# Patient Record
Sex: Female | Born: 1965 | Race: White | Hispanic: No | Smoking: Current every day smoker
Health system: Southern US, Community
[De-identification: ages and names within clinical notes are randomized; demographics above are authoritative.]

## PROBLEM LIST (undated history)

## (undated) DIAGNOSIS — D649 Anemia, unspecified: Secondary | ICD-10-CM

## (undated) DIAGNOSIS — F329 Major depressive disorder, single episode, unspecified: Secondary | ICD-10-CM

## (undated) DIAGNOSIS — G8918 Other acute postprocedural pain: Secondary | ICD-10-CM

## (undated) DIAGNOSIS — I10 Essential (primary) hypertension: Secondary | ICD-10-CM

## (undated) DIAGNOSIS — F32A Depression, unspecified: Secondary | ICD-10-CM

## (undated) HISTORY — PX: LAPAROSCOPIC LYSIS INTESTINAL ADHESIONS: SUR778

## (undated) HISTORY — DX: Anemia, unspecified: D64.9

## (undated) HISTORY — DX: Major depressive disorder, single episode, unspecified: F32.9

## (undated) HISTORY — DX: Other acute postprocedural pain: G89.18

## (undated) HISTORY — PX: TONSILLECTOMY: SUR1361

## (undated) HISTORY — DX: Depression, unspecified: F32.A

## (undated) HISTORY — PX: HERNIA REPAIR: SHX51

---

## 1990-03-25 DIAGNOSIS — G8918 Other acute postprocedural pain: Secondary | ICD-10-CM

## 1990-03-25 HISTORY — DX: Other acute postprocedural pain: G89.18

## 1990-03-25 HISTORY — PX: PANCREATICODUODENECTOMY: SUR1000

## 1997-11-09 ENCOUNTER — Other Ambulatory Visit: Admission: RE | Admit: 1997-11-09 | Discharge: 1997-11-09 | Payer: Self-pay | Admitting: Obstetrics and Gynecology

## 1998-01-19 ENCOUNTER — Ambulatory Visit (HOSPITAL_COMMUNITY): Admission: RE | Admit: 1998-01-19 | Discharge: 1998-01-19 | Payer: Self-pay | Admitting: Gastroenterology

## 1998-01-19 ENCOUNTER — Encounter: Payer: Self-pay | Admitting: Gastroenterology

## 1998-02-02 ENCOUNTER — Encounter: Payer: Self-pay | Admitting: Gastroenterology

## 1998-02-02 ENCOUNTER — Ambulatory Visit (HOSPITAL_COMMUNITY): Admission: RE | Admit: 1998-02-02 | Discharge: 1998-02-02 | Payer: Self-pay | Admitting: Gastroenterology

## 1999-03-16 ENCOUNTER — Encounter: Payer: Self-pay | Admitting: Family Medicine

## 1999-03-16 ENCOUNTER — Encounter: Admission: RE | Admit: 1999-03-16 | Discharge: 1999-03-16 | Payer: Self-pay | Admitting: Family Medicine

## 1999-04-02 ENCOUNTER — Ambulatory Visit (HOSPITAL_COMMUNITY): Admission: RE | Admit: 1999-04-02 | Discharge: 1999-04-02 | Payer: Self-pay | Admitting: Gastroenterology

## 2000-04-27 ENCOUNTER — Emergency Department (HOSPITAL_COMMUNITY): Admission: EM | Admit: 2000-04-27 | Discharge: 2000-04-28 | Payer: Self-pay | Admitting: Emergency Medicine

## 2000-04-28 ENCOUNTER — Encounter: Payer: Self-pay | Admitting: Emergency Medicine

## 2000-09-19 ENCOUNTER — Other Ambulatory Visit: Admission: RE | Admit: 2000-09-19 | Discharge: 2000-09-19 | Payer: Self-pay | Admitting: Obstetrics and Gynecology

## 2000-10-09 ENCOUNTER — Encounter: Payer: Self-pay | Admitting: Emergency Medicine

## 2000-10-09 ENCOUNTER — Emergency Department (HOSPITAL_COMMUNITY): Admission: EM | Admit: 2000-10-09 | Discharge: 2000-10-09 | Payer: Self-pay | Admitting: Emergency Medicine

## 2001-03-25 DIAGNOSIS — D649 Anemia, unspecified: Secondary | ICD-10-CM

## 2001-03-25 HISTORY — PX: GASTRIC BYPASS: SHX52

## 2001-03-25 HISTORY — DX: Anemia, unspecified: D64.9

## 2001-10-19 ENCOUNTER — Other Ambulatory Visit (HOSPITAL_COMMUNITY): Admission: RE | Admit: 2001-10-19 | Discharge: 2001-10-30 | Payer: Self-pay | Admitting: Psychiatry

## 2002-01-12 ENCOUNTER — Inpatient Hospital Stay (HOSPITAL_COMMUNITY): Admission: EM | Admit: 2002-01-12 | Discharge: 2002-01-18 | Payer: Self-pay | Admitting: Psychiatry

## 2003-02-08 ENCOUNTER — Emergency Department (HOSPITAL_COMMUNITY): Admission: EM | Admit: 2003-02-08 | Discharge: 2003-02-08 | Payer: Self-pay | Admitting: Emergency Medicine

## 2004-01-12 ENCOUNTER — Encounter: Admission: RE | Admit: 2004-01-12 | Discharge: 2004-01-12 | Payer: Self-pay

## 2011-03-14 ENCOUNTER — Encounter (HOSPITAL_COMMUNITY): Payer: Self-pay | Admitting: Psychiatry

## 2011-03-14 ENCOUNTER — Ambulatory Visit (INDEPENDENT_AMBULATORY_CARE_PROVIDER_SITE_OTHER): Payer: BC Managed Care – PPO | Admitting: Psychiatry

## 2011-03-14 DIAGNOSIS — F329 Major depressive disorder, single episode, unspecified: Secondary | ICD-10-CM

## 2011-03-14 DIAGNOSIS — G4701 Insomnia due to medical condition: Secondary | ICD-10-CM

## 2011-03-14 DIAGNOSIS — G4700 Insomnia, unspecified: Secondary | ICD-10-CM

## 2011-03-14 DIAGNOSIS — F331 Major depressive disorder, recurrent, moderate: Secondary | ICD-10-CM | POA: Insufficient documentation

## 2011-03-14 DIAGNOSIS — G8918 Other acute postprocedural pain: Secondary | ICD-10-CM

## 2011-03-14 MED ORDER — QUETIAPINE FUMARATE 50 MG PO TABS: 50.0000 mg | ORAL_TABLET | Freq: Two times a day (BID) | ORAL | Status: DC

## 2011-03-14 MED ORDER — TRAZODONE HCL 150 MG PO TABS: 150.0000 mg | ORAL_TABLET | Freq: Every day | ORAL | Status: DC

## 2011-03-14 NOTE — Patient Instructions (Signed)
In addition to the medications he been taking as additionally the Wellbutrin and citalopram Celexa I have suggested that he take the Seroquel 50 mg 1-2 pills at night without learning half hours before bedtime to also instead of taking the Ambien which has not helped his sleep, try the trazodone half a tablet of the 150 mg or a whole tablet depending on how quickly fall asleep. If he should wake up before 2 AM usually is Ambien to help you fall back to sleep since she were only getting of 1-3 hours sleep after taking that. You have been requested to talk with the pharmacist before you take these medications by the pharmacy to be sure that there is no significant drug drug interaction with the other medications to been taking. Remember if you have any adverse effects or ineffective response to these medications you're invited to call 9 AM to 4:30 PM at our clinic on Monday through Friday. Any time after hours or on the weekend feel free to call the behavioral health center on the crisis card that you have been given that is available to you especially for the purpose of calling anyone before you have any before you've taken any action for his suicidal thoughts. Consider alternative techniques to possibly increase relieve the pain you've been experiencing. You have discussed down Internet group therapy, acupuncture hypnosis and most recently a rate he REI KI please return and 3 weeks.
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
## 2011-03-15 ENCOUNTER — Telehealth (HOSPITAL_COMMUNITY): Payer: Self-pay

## 2011-03-15 NOTE — Telephone Encounter (Signed)
PLEASE SEND RX FOR SEROQUEL AND TRAZADONE TO [facility] [address] [place] ASAP. PT WENT 2X TO PICK UP AND THEY WERE NOT THERE.

## 2011-03-15 NOTE — Telephone Encounter (Signed)
[facility] [address] Shelley Koch is contacted. The pharmacist receives to request to fill the prescriptions that were intended to be ordered yesterday: Seroquel 50 mg 1 by mouth twice a day for 30 days and trazodone 150 mg 1 by mouth each bedtime for 30 days. Pharmacist said he will call the patient,Shelley Koch, when they are ready to be picked up.

## 2011-03-21 ENCOUNTER — Telehealth (HOSPITAL_COMMUNITY): Payer: Self-pay

## 2011-03-21 DIAGNOSIS — M25569 Pain in unspecified knee: Secondary | ICD-10-CM

## 2011-03-21 DIAGNOSIS — M25559 Pain in unspecified hip: Secondary | ICD-10-CM | POA: Insufficient documentation

## 2011-03-21 NOTE — Telephone Encounter (Signed)
[facility] calls to report that after starting Seroquel and trazodone she wakes with greater intensity of pain in her knee and hip. She says she routinely uses pain medication before she gets up and uses a heat pad.  Yesterday she had an ablation of the uterus and has tolerated it very well. She took medication last night for pain and slept about 4 hours. He has been taking the one pill of Seroquel, 1 pill of trazodone and uses the Ambien if she wakes up before 2 AM. Since she began that routine, she wakes up and finds that she has greater muscle and joint pain. She routinely takes pain medication before she gets up and uses a heating pad and still, her level of pain has increased. She suspects the Seroquel and the trazodone.  It is suggested that the change in barometric pressure which has been significant with the new front coming in. This change in pressure made be taken into consideration because it often causes greater joint pain. It is suggested that she take to of the Seroquel 50 mg tabs about 1 hour before bed and then take trazodone roughly 30 minutes before sleep. She may still use the Ambien before 2 AM if she wakes  she agrees to try his plan and if she continues to have joint and muscle pain she is to stop the Seroquel altogether.

## 2011-04-01 ENCOUNTER — Other Ambulatory Visit (HOSPITAL_COMMUNITY): Payer: Self-pay | Admitting: Psychiatry

## 2011-04-02 ENCOUNTER — Ambulatory Visit (HOSPITAL_COMMUNITY): Payer: Self-pay | Admitting: Psychiatry

## 2011-04-02 ENCOUNTER — Telehealth (HOSPITAL_COMMUNITY): Payer: Self-pay | Admitting: Psychiatry

## 2011-04-02 MED ORDER — QUETIAPINE FUMARATE 100 MG PO TABS: 100.0000 mg | ORAL_TABLET | Freq: Two times a day (BID) | ORAL | Status: DC

## 2011-04-02 NOTE — Telephone Encounter (Signed)
Shelley Koch was not having restorative sleep and was told about a week ago to try to 50 mg Seroquel twice a day. She's she reported that she is getting better sleep although she doesn't always like the way the Seroquel makes her feel.  The pharmacy order is adjusted to reflect the increased dose. She will be taking Seroquel 100 mg twice a day. She is cautioned about the side effect of extra hunger. She is encouraged to snack on 0-calorie foods and drink lots of water. She plans to return to the office on Thursday.

## 2011-04-04 ENCOUNTER — Other Ambulatory Visit: Payer: Self-pay | Admitting: Internal Medicine

## 2011-04-04 ENCOUNTER — Encounter (HOSPITAL_COMMUNITY): Payer: Self-pay | Admitting: Psychiatry

## 2011-04-04 ENCOUNTER — Ambulatory Visit (INDEPENDENT_AMBULATORY_CARE_PROVIDER_SITE_OTHER): Payer: BC Managed Care – PPO | Admitting: Psychiatry

## 2011-04-04 DIAGNOSIS — F329 Major depressive disorder, single episode, unspecified: Secondary | ICD-10-CM

## 2011-04-04 DIAGNOSIS — Z1231 Encounter for screening mammogram for malignant neoplasm of breast: Secondary | ICD-10-CM

## 2011-04-04 DIAGNOSIS — G4700 Insomnia, unspecified: Secondary | ICD-10-CM

## 2011-04-04 DIAGNOSIS — E669 Obesity, unspecified: Secondary | ICD-10-CM

## 2011-04-04 MED ORDER — QUETIAPINE FUMARATE 100 MG PO TABS: 100.0000 mg | ORAL_TABLET | Freq: Two times a day (BID) | ORAL | Status: DC

## 2011-04-04 MED ORDER — TRAZODONE HCL 150 MG PO TABS: 150.0000 mg | ORAL_TABLET | Freq: Every day | ORAL | Status: DC

## 2011-04-04 MED ORDER — ZOLPIDEM TARTRATE 10 MG PO TABS: 10.0000 mg | ORAL_TABLET | Freq: Every evening | ORAL | Status: DC | PRN

## 2011-04-04 MED ORDER — CITALOPRAM HYDROBROMIDE 40 MG PO TABS: 40.0000 mg | ORAL_TABLET | Freq: Every day | ORAL | Status: DC

## 2011-04-04 MED ORDER — BUPROPION HCL ER (XL) 150 MG PO TB24: 150.0000 mg | ORAL_TABLET | Freq: Every day | ORAL | Status: DC

## 2011-04-04 NOTE — Patient Instructions (Addendum)
You have been given your prescriptions and please continue unless you have more trouble with the Seroquel he can call back and come in for a brief appointment to reevaluate that. The issue of overeating is a concern and I do refer you to weight watchers it will give you guidelines if you choose to get into a program that will help help support your effort to control your eating and that in part is caused by the medication. Remember to call behavioral Health Center [phone] if you have any extreme distress or suicidal thoughts or 911. Please return in one month.

## 2011-04-04 NOTE — Progress Notes (Signed)
Patient ID: Shelley Koch, female   DOB: 1965/09/28, 46 y.o.   MRN: [id number] Today [place] reports that she has had some improved sleep and is feeling better at work. She has been taking the Seroquel 100 mg twice a day and used as Seroquel/trazodone at night if she wakes up she uses the Ambien. She has noticed that after starting the Seroquel that she has less control of hunger and has gained some weight. This is a concern to her and the choice of Seroquel may be reevaluated and another medication substituted. She agrees to continue with the Seroquel for another month and then reevaluate the choice. She is recovering fairly well from a GYN procedure.  She believes that she is working better at work and feeling more productive. She denies any suicidal thoughts.

## 2011-04-14 ENCOUNTER — Encounter: Payer: Self-pay | Admitting: Emergency Medicine

## 2011-04-14 ENCOUNTER — Emergency Department
Admission: EM | Admit: 2011-04-14 | Discharge: 2011-04-14 | Disposition: A | Discharge status: Home / Self Care | Payer: BC Managed Care – PPO | Source: Home / Self Care | Attending: Emergency Medicine | Admitting: Emergency Medicine

## 2011-04-14 DIAGNOSIS — M7060 Trochanteric bursitis, unspecified hip: Secondary | ICD-10-CM

## 2011-04-14 DIAGNOSIS — M76899 Other specified enthesopathies of unspecified lower limb, excluding foot: Secondary | ICD-10-CM

## 2011-04-14 NOTE — ED Notes (Signed)
Right hip pain for "weeks" with recent exacerbation.

## 2011-04-14 NOTE — ED Provider Notes (Signed)
History     CSN: [id number]  Arrival date & time 04/14/11  1114   First MD Initiated Contact with Patient 04/14/11 1153      Chief Complaint  Patient presents with  . Hip Pain    (Consider location/radiation/quality/duration/timing/severity/associated sxs/prior treatment) HPI This patient presents today with right hip pain for the last few weeks. It is located on the outer aspect of her hip and leg and does radiate a little bit down the outside of her leg as well. She is already on hydrocodone for chronic pain and she does not think this is helping. She's been taking some ibuprofen as well and this may seem to help a little bit. She has been doing some working out with a Psychologist, educational but is unsure if this has caused the irritation. No previous hip injuries in the past.  Past Medical History  Diagnosis Date  . Anemia 2003  . Depression   . Pain following surgery or procedure 1992    chronic  persistent    Past Surgical History  Procedure Date  . Pancreaticoduodenectomy 1992  . Laparoscopic lysis intestinal adhesions     after 2003  . Gastric bypass 2003    Family History  Problem Relation Age of Onset  . Depression Mother     History  Substance Use Topics  . Smoking status: Never Smoker   . Smokeless tobacco: Not on file  . Alcohol Use: No    OB History    Grav Para Term Preterm Abortions TAB SAB Ect Mult Living                  Review of Systems  Allergies  Codeine and Erythromycin  Home Medications   Current Outpatient Rx  Name Route Sig Dispense Refill  . BUPROPION HCL ER (XL) 150 MG PO TB24 Oral Take 1 tablet (150 mg total) by mouth daily after breakfast. 30 tablet 1  . CITALOPRAM HYDROBROMIDE 40 MG PO TABS Oral Take 1 tablet (40 mg total) by mouth daily. 30 tablet 1  . DIAZEPAM 10 MG PO TABS      . HYDROCHLOROTHIAZIDE 12.5 MG PO CAPS      . HYDROCODONE-ACETAMINOPHEN 10-325 MG PO TABS      . HYOSCYAMINE SULFATE 0.125 MG SL SUBL      . IBUPROFEN 800 MG  PO TABS      . LISINOPRIL 10 MG PO TABS      . LISINOPRIL-HYDROCHLOROTHIAZIDE 20-25 MG PO TABS      . NORETHINDRONE ACETATE 5 MG PO TABS      . PROMETHAZINE HCL 25 MG PO TABS      . QUETIAPINE FUMARATE 100 MG PO TABS Oral Take 1 tablet (100 mg total) by mouth 2 (two) times daily. 60 tablet 1  . TRAZODONE HCL 150 MG PO TABS Oral Take 1 tablet (150 mg total) by mouth at bedtime. 30 tablet 1  . ZOLPIDEM TARTRATE 10 MG PO TABS Oral Take 1 tablet (10 mg total) by mouth at bedtime as needed for sleep. 30 tablet 1    BP 133/83  Pulse 87  Temp(Src) 98.9 F (37.2 C) (Oral)  Resp 18  Ht 5\' 10"  (1.778 m)  Wt 276 lb (125.193 kg)  BMI 39.60 kg/m2  SpO2 99%  LMP 03/04/2011  Physical Exam  Nursing note and vitals reviewed. Constitutional: She is oriented to person, place, and time. She appears well-developed and well-nourished.  HENT:  Head: Normocephalic and atraumatic.  Eyes: No scleral  icterus.  Neck: Neck supple.  Cardiovascular: Regular rhythm and normal heart sounds.   Pulmonary/Chest: Effort normal and breath sounds normal. No respiratory distress.  Musculoskeletal:       Gen. examination demonstrates that she is obese. Left hip examination demonstrates for range of motion flexion-extension and a long roll with internal and external rotation. She is tender at the greater trochanter and also down the IT band. There is no swelling or bruising or any deformity that is felt. Distal neurovascular status is intact. She also has some mild tenderness at the lateral femoral condyle.  Neurological: She is alert and oriented to person, place, and time.  Skin: Skin is warm and dry.  Psychiatric: She has a normal mood and affect. Her speech is normal.    ED Course  Procedures (including critical care time)  Labs Reviewed - No data to display No results found.   No diagnosis found.    MDM   Due to the history and examination, this is most likely a trochanteric bursitis. We have injected  her here in clinic and I advised her to use ice and gentle massage today. I've also advised her to let her trainer nose so maybe that they can start using a foam roller or other modalities. If she is not improving after the first couple days, I have given her the information for sports medicine that she can followup with and at that time consider x-rays or physical therapy. No medications were prescribed today.  Consent obtained and verified. Sterile betadine prep. Furthur cleansed with alcohol. Topical analgesic spray: Ethyl chloride. Joint: Right trochanteric bursa Completed without difficulty Meds: 1 cc of 40 mg of Depo-Medrol +3 cc of 1% lidocaine without epinephrine Needle: 22-gauge needle, 2 inches Aftercare instructions and Red flags advised.     [name], MD 04/14/11 1259

## 2011-04-16 ENCOUNTER — Encounter: Payer: Self-pay | Admitting: Family Medicine

## 2011-04-16 ENCOUNTER — Ambulatory Visit (INDEPENDENT_AMBULATORY_CARE_PROVIDER_SITE_OTHER): Payer: BC Managed Care – PPO | Admitting: Family Medicine

## 2011-04-16 VITALS — BP 151/92 | HR 111 | Temp 97.9°F | Ht 70.0 in | Wt 272.0 lb

## 2011-04-16 DIAGNOSIS — M25559 Pain in unspecified hip: Secondary | ICD-10-CM

## 2011-04-16 NOTE — Patient Instructions (Signed)
You have classic trochanteric bursitis (with secondary IT Band syndrome - these are related). Ice or heat 15 minutes at a time 3-4 times a day. Advil 4 tablets (200mg  each) three times a day with food. Continue hydrocodone for pain - discuss with who fills this if you need something stronger. Cortisone injection typically kicks in within 4-5 days - you should know a week out if it's going to help you. Avoid painful activities when possible Avoid lying on affected side Hip abduction and external rotation exercises - physical therapy will review these with you - do daily for next 6 weeks (3 sets of 10).   Follow up with me in 1 month for a recheck.

## 2011-04-18 ENCOUNTER — Encounter: Payer: Self-pay | Admitting: Family Medicine

## 2011-04-18 NOTE — Assessment & Plan Note (Signed)
Right greater trochanteric bursitis - classic presentation.  Just had cortisone injection and informed this can take up to a week for benefit.  Has vicodin which she takes for chronic pain.  Will start PT and home exercise/stretching program.  Icing as needed.  Advil as needed.  F/u in 1 month.  Can try repeat injection if not improving.  Doubt MRI of low back or hip would be beneficial based on history and exam.

## 2011-04-18 NOTE — Progress Notes (Signed)
Subjective:    Patient ID: Shelley Koch, female    DOB: 25-Jan-1966, 46 y.o.   MRN: [id number]  PCP: Dr. Damien Fusi  HPI 46 yo F here for right hip pain.  Patient denies any known right hip injury. States over past several weeks has had lateral right hip pain worse with lying on this side. + night pain. No back pain No numbness/tingling. Radiates only down lateral aspect of upper leg. No problems with left leg. Went to [facility] on Sunday and had injection that made this worse. No having pain with walking and prolonged standing. No groin pain.  Past Medical History  Diagnosis Date  . Anemia 2003  . Depression   . Pain following surgery or procedure 1992    chronic  persistent    Current Outpatient Prescriptions on File Prior to Visit  Medication Sig Dispense Refill  . buPROPion (WELLBUTRIN XL) 150 MG 24 hr tablet Take 1 tablet (150 mg total) by mouth daily after breakfast.  30 tablet  1  . citalopram (CELEXA) 40 MG tablet Take 1 tablet (40 mg total) by mouth daily.  30 tablet  1  . diazepam (VALIUM) 10 MG tablet       . HYDROcodone-acetaminophen (NORCO) 10-325 MG per tablet       . hyoscyamine (LEVSIN SL) 0.125 MG SL tablet       . ibuprofen (ADVIL,MOTRIN) 800 MG tablet       . lisinopril-hydrochlorothiazide (PRINZIDE,ZESTORETIC) 20-25 MG per tablet       . norethindrone (AYGESTIN) 5 MG tablet       . promethazine (PHENERGAN) 25 MG tablet       . QUEtiapine (SEROQUEL) 100 MG tablet Take 1 tablet (100 mg total) by mouth 2 (two) times daily.  60 tablet  1  . traZODone (DESYREL) 150 MG tablet Take 1 tablet (150 mg total) by mouth at bedtime.  30 tablet  1  . zolpidem (AMBIEN) 10 MG tablet Take 1 tablet (10 mg total) by mouth at bedtime as needed for sleep.  30 tablet  1    Past Surgical History  Procedure Date  . Pancreaticoduodenectomy 1992  . Laparoscopic lysis intestinal adhesions     after 2003  . Gastric bypass 2003  . Hernia repair     Allergies  Allergen  Reactions  . Codeine   . Erythromycin     All 'mycin' family abx    History   Social History  . Marital Status: Divorced    Spouse Name: N/A    Number of Children: N/A  . Years of Education: N/A   Occupational History  . Not on file.   Social History Main Topics  . Smoking status: Current Everyday Smoker -- 1.5 packs/day  . Smokeless tobacco: Not on file   Comment: 1/2 pack per day  . Alcohol Use: No  . Drug Use: No  . Sexually Active: Not Currently   Other Topics Concern  . Not on file   Social History Narrative  . No narrative on file    Family History  Problem Relation Age of Onset  . Depression Mother   . Hypertension Mother   . Hyperlipidemia Father   . Heart attack Father   . Diabetes Neg Hx   . Sudden death Neg Hx     BP 151/92  Pulse 111  Temp(Src) 97.9 F (36.6 C) (Oral)  Ht 5\' 10"  (1.778 m)  Wt 272 lb (123.378 kg)  BMI 39.03 kg/m2  LMP 03/04/2011  Review of Systems See HPI above.    Objective:   Physical Exam Gen: NAD  R hip: No gross deformity, bruising.  Mild swelling over greater trochanter. TTP at greater trochanter reproducing her pain.  No SI, lower back TTP.  TTP mildly through IT band. FROM with negative logroll. Strength 4-/5 with hip abduction and painful, 5/5 on left side.   NVI distally.    Assessment & Plan:  1. Right greater trochanteric bursitis - classic presentation.  Just had cortisone injection and informed this can take up to a week for benefit.  Has vicodin which she takes for chronic pain.  Will start PT and home exercise/stretching program.  Icing as needed.  Advil as needed.  F/u in 1 month.  Can try repeat injection if not improving.  Doubt MRI of low back or hip would be beneficial based on history and exam.

## 2011-04-19 DIAGNOSIS — E669 Obesity, unspecified: Secondary | ICD-10-CM | POA: Insufficient documentation

## 2011-04-22 ENCOUNTER — Encounter: Payer: Self-pay | Admitting: Family Medicine

## 2011-04-22 ENCOUNTER — Ambulatory Visit (INDEPENDENT_AMBULATORY_CARE_PROVIDER_SITE_OTHER): Payer: BC Managed Care – PPO | Admitting: Family Medicine

## 2011-04-22 ENCOUNTER — Ambulatory Visit (HOSPITAL_BASED_OUTPATIENT_CLINIC_OR_DEPARTMENT_OTHER)
Admission: RE | Admit: 2011-04-22 | Discharge: 2011-04-22 | Disposition: A | Discharge status: Home / Self Care | Payer: BC Managed Care – PPO | Source: Ambulatory Visit | Attending: Family Medicine | Admitting: Family Medicine

## 2011-04-22 VITALS — BP 148/92 | HR 92 | Temp 98.1°F | Ht 70.0 in | Wt 270.0 lb

## 2011-04-22 DIAGNOSIS — M25562 Pain in left knee: Secondary | ICD-10-CM

## 2011-04-22 DIAGNOSIS — M25559 Pain in unspecified hip: Secondary | ICD-10-CM

## 2011-04-22 DIAGNOSIS — M25569 Pain in unspecified knee: Secondary | ICD-10-CM

## 2011-04-22 NOTE — Assessment & Plan Note (Signed)
likely a dynamic issue and worsened from gait change from severe right hip pain.  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  Degenerative meniscal tear another consideration.  Consider intraarticular cortisone injection if this continues to worsen.

## 2011-04-22 NOTE — Progress Notes (Signed)
Subjective:    Patient ID: Shelley Koch, female    DOB: 09/02/1965, 46 y.o.   MRN: [id number]  PCP: Dr. Damien Fusi  Hip Pain   Knee Pain    46 yo F here for f/u right hip pain.  1/22: Patient denies any known right hip injury. States over past several weeks has had lateral right hip pain worse with lying on this side. + night pain. No back pain No numbness/tingling. Radiates only down lateral aspect of upper leg. No problems with left leg. Went to [facility] on Sunday and had injection that made this worse. No having pain with walking and prolonged standing. No groin pain.  1/28: Patient reports right lateral hip pain has not improved with injection - would like to try repeat injection. Due to start PT on Wednesday for this. No real change in symptoms otherwise from 1/22 visit. Has had increasing left knee pain - anteriorly medial > lateral. No catching, locking, giving out. No swelling or bruising. Pain in knee worse over weekend and has bothered her in past when working out.  Past Medical History  Diagnosis Date  . Anemia 2003  . Depression   . Pain following surgery or procedure 1992    chronic  persistent    Current Outpatient Prescriptions on File Prior to Visit  Medication Sig Dispense Refill  . buPROPion (WELLBUTRIN XL) 150 MG 24 hr tablet Take 1 tablet (150 mg total) by mouth daily after breakfast.  30 tablet  1  . citalopram (CELEXA) 40 MG tablet Take 1 tablet (40 mg total) by mouth daily.  30 tablet  1  . diazepam (VALIUM) 10 MG tablet       . HYDROcodone-acetaminophen (NORCO) 10-325 MG per tablet       . hyoscyamine (LEVSIN SL) 0.125 MG SL tablet       . ibuprofen (ADVIL,MOTRIN) 800 MG tablet       . lisinopril-hydrochlorothiazide (PRINZIDE,ZESTORETIC) 20-25 MG per tablet       . norethindrone (AYGESTIN) 5 MG tablet       . promethazine (PHENERGAN) 25 MG tablet       . QUEtiapine (SEROQUEL) 100 MG tablet Take 1 tablet (100 mg total) by mouth 2 (two)  times daily.  60 tablet  1  . traZODone (DESYREL) 150 MG tablet Take 1 tablet (150 mg total) by mouth at bedtime.  30 tablet  1  . zolpidem (AMBIEN) 10 MG tablet Take 1 tablet (10 mg total) by mouth at bedtime as needed for sleep.  30 tablet  1    Past Surgical History  Procedure Date  . Pancreaticoduodenectomy 1992  . Laparoscopic lysis intestinal adhesions     after 2003  . Gastric bypass 2003  . Hernia repair     Allergies  Allergen Reactions  . Codeine   . Erythromycin     All 'mycin' family abx    History   Social History  . Marital Status: Divorced    Spouse Name: N/A    Number of Children: N/A  . Years of Education: N/A   Occupational History  . Not on file.   Social History Main Topics  . Smoking status: Current Everyday Smoker -- 1.5 packs/day  . Smokeless tobacco: Not on file   Comment: 1/2 pack per day  . Alcohol Use: No  . Drug Use: No  . Sexually Active: Not Currently   Other Topics Concern  . Not on file   Social History Narrative  .  No narrative on file    Family History  Problem Relation Age of Onset  . Depression Mother   . Hypertension Mother   . Hyperlipidemia Father   . Heart attack Father   . Diabetes Neg Hx   . Sudden death Neg Hx     BP 148/92  Pulse 92  Temp(Src) 98.1 F (36.7 C) (Oral)  Ht 5\' 10"  (1.778 m)  Wt 270 lb (122.471 kg)  BMI 38.74 kg/m2  LMP 03/04/2011  Review of Systems  See HPI above.    Objective:   Physical Exam  Gen: NAD  R hip: No gross deformity, bruising.  Mild swelling over greater trochanter. TTP at greater trochanter reproducing her pain.  No SI, lower back TTP.  TTP mildly through IT band. FROM with negative logroll. Strength 4-/5 with hip abduction and painful, 5/5 on left side.   NVI distally.  L knee: No gross deformity, ecchymoses, swelling. Mild medial > lateral joint line TTP.  No post patellar facet TTP. FROM. Negative ant/post drawers. Negative valgus/varus testing. Negative  lachmanns. Negative mcmurrays, apleys, patellar apprehension. NV intact distally.     Assessment & Plan:  1. Right greater trochanteric bursitis - classic presentation.  Repeat cortisone injection today.  Continue advil, vicodin prn.  Start PT on Wednesday.  If still severe would consider lumbar spine MRI (doubt intraarticular hip pathology - negative logroll and no groin pain) as sometimes radiculopathy presents in this fashion though is less likely.  F/u in 1 month.  2. Left knee pain - likely a dynamic issue and worsened from gait change from severe right hip pain.  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  Degenerative meniscal tear another consideration.  Consider intraarticular cortisone injection if this continues to worsen.

## 2011-04-22 NOTE — Assessment & Plan Note (Signed)
Right greater trochanteric bursitis - classic presentation.  Repeat cortisone injection today.  Continue advil, vicodin prn.  Start PT on Wednesday.  If still severe would consider lumbar spine MRI (doubt intraarticular hip pathology - negative logroll and no groin pain) as sometimes radiculopathy presents in this fashion though is less likely.  F/u in 1 month.

## 2011-04-23 ENCOUNTER — Ambulatory Visit: Payer: Self-pay

## 2011-04-23 ENCOUNTER — Ambulatory Visit
Admission: RE | Admit: 2011-04-23 | Discharge: 2011-04-23 | Disposition: A | Discharge status: Home / Self Care | Payer: BC Managed Care – PPO | Source: Ambulatory Visit | Attending: Internal Medicine | Admitting: Internal Medicine

## 2011-04-23 DIAGNOSIS — Z1231 Encounter for screening mammogram for malignant neoplasm of breast: Secondary | ICD-10-CM

## 2011-04-24 ENCOUNTER — Ambulatory Visit: Payer: BC Managed Care – PPO | Admitting: Physical Therapy

## 2011-05-31 ENCOUNTER — Other Ambulatory Visit (HOSPITAL_COMMUNITY): Payer: Self-pay | Admitting: Psychiatry

## 2011-05-31 DIAGNOSIS — G4700 Insomnia, unspecified: Secondary | ICD-10-CM

## 2011-05-31 MED ORDER — ZOLPIDEM TARTRATE 10 MG PO TABS: 10.0000 mg | ORAL_TABLET | Freq: Every evening | ORAL | Status: DC | PRN

## 2011-06-06 ENCOUNTER — Other Ambulatory Visit (HOSPITAL_COMMUNITY): Payer: Self-pay

## 2011-06-06 ENCOUNTER — Other Ambulatory Visit (HOSPITAL_COMMUNITY): Payer: Self-pay | Admitting: Psychiatry

## 2011-06-06 DIAGNOSIS — G4700 Insomnia, unspecified: Secondary | ICD-10-CM

## 2011-06-06 MED ORDER — TRAZODONE HCL 150 MG PO TABS: 150.0000 mg | ORAL_TABLET | Freq: Every day | ORAL | Status: DC

## 2011-06-06 NOTE — Telephone Encounter (Signed)
ok 

## 2011-06-14 ENCOUNTER — Ambulatory Visit (HOSPITAL_COMMUNITY): Payer: Self-pay | Admitting: Psychiatry

## 2011-06-28 ENCOUNTER — Encounter (HOSPITAL_COMMUNITY): Payer: Self-pay | Admitting: Psychiatry

## 2011-06-28 ENCOUNTER — Ambulatory Visit (INDEPENDENT_AMBULATORY_CARE_PROVIDER_SITE_OTHER): Payer: BC Managed Care – PPO | Admitting: Psychiatry

## 2011-06-28 VITALS — BP 158/85 | HR 85 | Ht 70.0 in | Wt 280.0 lb

## 2011-06-28 DIAGNOSIS — F329 Major depressive disorder, single episode, unspecified: Secondary | ICD-10-CM

## 2011-06-28 DIAGNOSIS — G4700 Insomnia, unspecified: Secondary | ICD-10-CM

## 2011-06-28 MED ORDER — ZOLPIDEM TARTRATE 10 MG PO TABS: 10.0000 mg | ORAL_TABLET | Freq: Every evening | ORAL | Status: DC | PRN

## 2011-06-28 MED ORDER — QUETIAPINE FUMARATE 100 MG PO TABS: 150.0000 mg | ORAL_TABLET | Freq: Two times a day (BID) | ORAL | Status: DC

## 2011-06-28 NOTE — Progress Notes (Signed)
Psychiatric Assessment Adult  Patient Identification:  Shelley Koch Date of Evaluation:  06/28/2011 Chief Complaint: "I was referred by my family doctor." History of Chief Complaint:    HPI Comments: The patient 46 y/o with  Major Depressive Disorder, moderate, reccurrent.  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 She was started Zoloft then Paxil, prozac, effexor, lexapro. The  Patient's reports one suicide attempt in 2003, by over dose. She reports she was divorced, lost her job, and was given a large prescription adderall.  She states that she started becoming manic and hallucinating.  Review of Systems  Psychiatric/Behavioral: Positive for dysphoric mood. Negative for suicidal ideas, behavioral problems, confusion, sleep disturbance, self-injury and agitation.   Physical Exam  Vitals reviewed. Constitutional: She appears well-developed and well-nourished. No distress.  Skin: She is not diaphoretic.    Depressive Symptoms: depressed mood, helplessness, Fatigue  (Hypo) Manic Symptoms:   Elevated Mood:  No Irritable Mood:  No Grandiosity:  No Distractibility:  No Lability of Mood:  No Delusions:  No Hallucinations:  No Impulsivity:  No Sexually Inappropriate Behavior:  No Financial Extravagance:  No Flight of Ideas:  No  Anxiety Symptoms: Excessive Worry:  Yes-specifically related to pain Panic Symptoms:  Yes Agoraphobia:  No Obsessive Compulsive: No  Symptoms: None, Specific Phobias:  Yes-dentist Social Anxiety:  No  Psychotic Symptoms:  Hallucinations: Yes None Delusions:  No Paranoia:  Yes-only when depressed, regarding whether people like her.   Ideas of Reference:  No  PTSD Symptoms: Ever had a traumatic exposure:  Yes Had a traumatic exposure in the last month:  No Re-experiencing: No None Hypervigilance:  No Hyperarousal: No None Avoidance: No None  Traumatic Brain  Injury: No   Past Psychiatric History: Diagnosis: MDD  Hospitalizations: One in 2003  Outpatient Care: Since the age of 60  Substance Abuse Care: none  Self-Mutilation: Patient denies  Suicidal Attempts: One attempt 2003.  Violent Behaviors: None   Past Medical History:   Past Medical History  Diagnosis Date  . Anemia 2003  . Pain following surgery or procedure 1992   History of Loss of Consciousness:  No Seizure History:  No Cardiac History:  No Allergies:   Allergies  Allergen Reactions  . Codeine   . Erythromycin     All 'mycin' family abx   Current Medications:  Current Outpatient Prescriptions  Medication Sig Dispense Refill  . buPROPion (WELLBUTRIN XL) 150 MG 24 hr tablet Take 1 tablet (150 mg total) by mouth daily after breakfast.  30 tablet  1  . citalopram (CELEXA) 40 MG tablet Take 1 tablet (40 mg total) by mouth daily.  30 tablet  1  . diazepam (VALIUM) 10 MG tablet       . HYDROcodone-acetaminophen (NORCO) 10-325 MG per tablet       . hyoscyamine (LEVSIN SL) 0.125 MG SL tablet       . ibuprofen (ADVIL,MOTRIN) 800 MG tablet       . lisinopril-hydrochlorothiazide (PRINZIDE,ZESTORETIC) 20-25 MG per tablet       . norethindrone (AYGESTIN) 5 MG tablet       . promethazine (PHENERGAN) 25 MG tablet       . QUEtiapine (SEROQUEL) 100 MG tablet Take 1 tablet (100 mg total) by mouth 2 (two) times daily.  60 tablet  1  . traZODone (DESYREL) 150 MG tablet Take 1 tablet (150 mg total) by mouth at  bedtime.  30 tablet  1  . zolpidem (AMBIEN) 10 MG tablet Take 1 tablet (10 mg total) by mouth at bedtime as needed for sleep.  30 tablet  0    Previous Psychotropic Medications:  Medication  Adderall  Prozac  Paxil  Zoloft  Citalopram  Ambien  Trazodone   Substance Abuse History in the last 12 months: Nicotine: Patient denies Alcohol: Patient denies Drug: Patient denies   Social History: Current Place of Residence: [place], [place] Place of Birth: [place],  [place] Family Members: Father and brother. Marital Status:  Divorced Children: None Relationships: Father is main source of support. Education:  Print production planner Problems/Performance: Fair Religious Beliefs/Practices: Pray. History of Abuse: none Occupational Experiences: Writer History:  None. Legal History: Patient. Hobbies/Interests: Work-shopping.  Family History:   Family History  Problem Relation Age of Onset  . Depression Mother   . Hypertension Mother   . Hyperlipidemia Father   . Heart attack Father   . Diabetes Neg Hx   . Sudden death Neg Hx     Mental Status Examination/Evaluation: Objective:  Appearance: Casual and Well Groomed  Eye Contact::  Good  Speech:  Clear and Coherent  Volume:  Normal  Mood:  "exasperated.  Affect:  Appropriate  Thought Process:  Coherent, Logical and Loose  Orientation:  Full  Thought Content:  WDL  Suicidal Thoughts:  No  Homicidal Thoughts:  No  Judgement:  Fair  Insight:  Good  Psychomotor Activity:  Normal  Akathisia:  No  Handed:  Right  AIMS (if indicated):  None  Assets:  Communication Skills Desire for Improvement Financial Resources/Insurance Social Support Talents/Skills Transportation Vocational/Educational   Assessment:   AXIS I Major Depressive Disorder, Moderate, recurrent  AXIS II No diagnosis  AXIS III  . Anemia 2003  . Pain following surgery or procedure 1992     AXIS IV other psychosocial or environmental problems  AXIS V 51-60 moderate symptoms   Treatment Plan/Recommendations:  1. Affirm with the patient that the medications are taken as ordered. Patient expressed understanding of how their medications were to be used.  2. Continue the following psychiatric medications as written prior to this appointment/ with the following changes:  a) Patient wishes to stop Seroquel due to sedation. She is currently taking 200 mg Will taper Seroquel over 3-4 weeks b) Continue buproprion XL 150  mg daily c) Continue Celexa 40 mg d) Continue zolpidem 10 mg daily e) Discontinue trazodone, patient has stopped taking this medication. f)  Discontinue Diazepam-patient is no longer taking this medication. 3. Therapy: brief supportive therapy provided. Continue current services.  4. Risks and benefits, side effects and alternatives discussed with patient, he/she was given an opportunity to ask questions about her medication, illness, and treatment. All current psychiatric medications have been reviewed and discussed with the patient and adjusted as clinically appropriate. The patient has been provided an accurate and updated list of the medications being now prescribed.  5. Patient told to call clinic if any problems occur. Patient advised to go to ER  if she should develop SI/HI, side effects, or if symptoms worsen. Has crisis numbers to call if needed.   6. No labs warranted at this time.   7. The patient was encouraged to keep all PCP and specialty clinic appointments.  8. Patient was instructed to return to clinic in 1 month.  9. The patient was advised to call and cancel their mental health appointment  within 24 hours of the appointment,  if they are unable to keep the appointment.  10. The patient expressed understanding     Jacqulyn Cane, MD 4/5/201311:20 AM

## 2011-07-01 ENCOUNTER — Telehealth (HOSPITAL_COMMUNITY): Payer: Self-pay

## 2011-07-01 DIAGNOSIS — G4700 Insomnia, unspecified: Secondary | ICD-10-CM

## 2011-07-01 DIAGNOSIS — F329 Major depressive disorder, single episode, unspecified: Secondary | ICD-10-CM

## 2011-07-01 MED ORDER — QUETIAPINE FUMARATE 50 MG PO TABS: 50.0000 mg | ORAL_TABLET | ORAL | Status: DC

## 2011-07-01 MED ORDER — TRAZODONE HCL 150 MG PO TABS: 150.0000 mg | ORAL_TABLET | Freq: Every day | ORAL | Status: DC

## 2011-07-01 NOTE — Telephone Encounter (Signed)
Pt called to advise per Dr. Request she has seraquel 100mg  12 tabs left, trazadone 150mg  6 tabs left from previous prescription/provider.

## 2011-07-01 NOTE — Telephone Encounter (Signed)
Will provide prescription for tapering dose of Seroquel.  Plan:  1. Patient oft

## 2011-07-02 ENCOUNTER — Encounter (HOSPITAL_COMMUNITY): Payer: Self-pay | Admitting: Psychiatry

## 2011-07-12 ENCOUNTER — Encounter (HOSPITAL_COMMUNITY): Payer: Self-pay | Admitting: Psychiatry

## 2011-07-12 ENCOUNTER — Ambulatory Visit (INDEPENDENT_AMBULATORY_CARE_PROVIDER_SITE_OTHER): Payer: BC Managed Care – PPO | Admitting: Psychiatry

## 2011-07-12 VITALS — BP 156/91 | HR 103 | Ht 70.0 in | Wt 286.0 lb

## 2011-07-12 DIAGNOSIS — F331 Major depressive disorder, recurrent, moderate: Secondary | ICD-10-CM

## 2011-07-12 DIAGNOSIS — G4700 Insomnia, unspecified: Secondary | ICD-10-CM

## 2011-07-12 DIAGNOSIS — F329 Major depressive disorder, single episode, unspecified: Secondary | ICD-10-CM

## 2011-07-12 MED ORDER — ZOLPIDEM TARTRATE 10 MG PO TABS: 10.0000 mg | ORAL_TABLET | Freq: Every evening | ORAL | Status: DC | PRN

## 2011-07-12 NOTE — Progress Notes (Signed)
[facility] Follow-up Outpatient Visit  Shelley Koch 06-Nov-1965  Date:  07/12/2010   History of Chief Complaint:  HPI Comments: The patient 46 y/o with Major Depressive Disorder, moderate, recurrent.  The patient reports some irritability but relates this to the simultaneous discontinuation of Topamax while we have been tapering quetiapine, though we had discussed the advantages of tapering only one medication at a time. The patient states she had decreased quetiapine up to 50 mg, but when she completely discontinued quetiapine she had an uncomfortable feeling. She reports that she continues to take quetiapine at 50 mg. She does endorse poor sleep and has been using quetiapine in combination with zolpidem. She denies any recent suicidal or homicidal ideation, intent or plans. She denies any medication side effects.  She report that she has been having difficulty motivating herself to get out of the house due to lack of motivation and energy.  Depressive Symptoms: depressed mood,  helplessness, Fatigue   (Hypo) Manic Symptoms:  Elevated Mood: No  Irritable Mood: No  Grandiosity: No  Distractibility: No  Lability of Mood: No  Delusions: No  Hallucinations: No  Impulsivity: No  Sexually Inappropriate Behavior: No  Financial Extravagance: No  Flight of Ideas: No   Anxiety Symptoms:  Excessive Worry: Yes-specifically related to pain  Panic Symptoms: Yes  Agoraphobia: No  Obsessive Compulsive: No  Symptoms: None,  Specific Phobias: Yes-dentist  Social Anxiety: No   Psychotic Symptoms:  Hallucinations: Yes None  Delusions: No  Paranoia: Yes-only when depressed, regarding whether people like her.  Ideas of Reference: No   PTSD Symptoms:  Ever had a traumatic exposure: Yes  Had a traumatic exposure in the last month: No  Re-experiencing: No None  Hypervigilance: No  Hyperarousal: No None  Avoidance: No None   Traumatic Brain Injury: No   Review of Systems    Psychiatric/Behavioral: Positive for dysphoric mood. Negative for suicidal ideas, behavioral problems, confusion, sleep disturbance, self-injury and agitation.   Physical Exam  Vitals reviewed.  Constitutional: She appears well-developed and well-nourished. No distress.  Skin: She is not diaphoretic.   Past Psychiatric History:  Diagnosis: Major Depressive Disorder   Hospitalizations: One in 2003   Outpatient Care: Since the age of 62   Substance Abuse Care: none   Self-Mutilation: Patient denies   Suicidal Attempts: One attempt 2003.   Violent Behaviors: None    Past Medical History:  Past Medical History   Diagnosis  Date   .  Anemia  2003   .  Pain following surgery or procedure  1992    History of Loss of Consciousness: No  Seizure History: No  Cardiac History: No  Allergies:  Allergen   Codeine   Erythromycin    Current Medications:  Current Outpatient Prescriptions   Medication  Sig  Dispense  Refill   .  buPROPion (WELLBUTRIN XL) 150 MG 24 hr tablet  Take 1 tablet (150 mg total) by mouth daily after breakfast.  30 tablet  1   .  citalopram (CELEXA) 40 MG tablet  Take 1 tablet (40 mg total) by mouth daily.  30 tablet  1   .  diazepam (VALIUM) 10 MG tablet      .  HYDROcodone-acetaminophen (NORCO) 10-325 MG per tablet      .  hyoscyamine (LEVSIN SL) 0.125 MG SL tablet      .  ibuprofen (ADVIL,MOTRIN) 800 MG tablet      .  lisinopril-hydrochlorothiazide (PRINZIDE,ZESTORETIC) 20-25 MG per  tablet      .  norethindrone (AYGESTIN) 5 MG tablet      .  promethazine (PHENERGAN) 25 MG tablet      .  QUEtiapine (SEROQUEL) 100 MG tablet  Take 1 tablet (100 mg total) by mouth 2 (two) times daily.  60 tablet  1   .  traZODone (DESYREL) 150 MG tablet  Take 1 tablet (150 mg total) by mouth at bedtime.  30 tablet  1   .  zolpidem (AMBIEN) 10 MG tablet  Take 1 tablet (10 mg total) by mouth at bedtime as needed for sleep.  30 tablet  0    Previous Psychotropic Medications:   Medication   Adderall   Prozac   Paxil   Zoloft   Citalopram   Ambien   Trazodone    Substance Abuse History in the last 12 months:  Nicotine: Patient denies  Alcohol: Patient denies  Drug: Patient denies   Filed Vitals:   07/12/11 1117  BP: 156/91  Pulse: 103  Height: 5\' 10"  (1.778 m)  Weight: 286 lb (129.729 kg)    Mental Status Examination/Evaluation:  Objective: Appearance: Casual and Well Groomed   Eye Contact:: Good   Speech: Clear and Coherent   Volume: Normal   Mood: "good"  Affect: Appropriate   Thought Process: Coherent, Logical and Loose   Orientation: Full   Thought Content: WDL   Suicidal Thoughts: No   Homicidal Thoughts: No   Judgement: Fair   Insight: Good   Psychomotor Activity: Normal   Akathisia: No   Handed: Right   AIMS (if indicated): None   Assets: Communication Skills  Desire for Improvement  Financial Resources/Insurance  Social Support  Talents/Skills  Transportation  Vocational/Educational   Assessment:  AXIS I  Major Depressive Disorder, Moderate, recurrent   AXIS II  No diagnosis   AXIS III  .  Anemia  2003    .  Pain following surgery or procedure  1992   AXIS IV  other psychosocial or environmental problems   AXIS V  51-60 moderate symptoms     Treatment Plan/Recommendations:    1. Affirm with the patient that the medications are taken as ordered. Patient expressed understanding of how their medications were to be used.  2. Continue the following psychiatric medications as written prior to this appointment/ with the following changes:  a) Patient is currently taking Seroquel 50 mg daily, she has tried to stop this but has an uncomfortable feeling,therefore will continue this medication and attempt to decrease this medication to half of a 50 mg tablet.  b) Continue buproprion XL 150 mg daily  c) Continue Celexa 40 mg  d) Continue zolpidem 10 mg daily if she fails to sleep with Trazodone. e) Continue Trazodone 150 mg  daily f) Discontinue Diazepam-patient is no longer taking this medication.  3. Therapy: brief supportive therapy provided. Continue current services.  4. Risks and benefits, side effects and alternatives discussed with patient, he/she was given an opportunity to ask questions about her medication, illness, and treatment. All current psychiatric medications have been reviewed and discussed with the patient and adjusted as clinically appropriate. The patient has been provided an accurate and updated list of the medications being now prescribed.  5. Patient told to call clinic if any problems occur. Patient advised to go to ER if she should develop SI/HI, side effects, or if symptoms worsen. Has crisis numbers to call if needed.  6. No labs warranted at this time.  7. The patient was encouraged to keep all PCP and specialty clinic appointments.  8. Patient was instructed to return to clinic in 1 month.  9.  The patient expressed understanding of the plan and agrees with the above.  Jacqulyn Cane, MD

## 2011-08-09 ENCOUNTER — Encounter (HOSPITAL_COMMUNITY): Payer: Self-pay | Admitting: Psychiatry

## 2011-08-09 ENCOUNTER — Ambulatory Visit (INDEPENDENT_AMBULATORY_CARE_PROVIDER_SITE_OTHER): Payer: BC Managed Care – PPO | Admitting: Psychiatry

## 2011-08-09 VITALS — BP 139/67 | HR 90 | Ht 70.0 in | Wt 286.0 lb

## 2011-08-09 DIAGNOSIS — F331 Major depressive disorder, recurrent, moderate: Secondary | ICD-10-CM

## 2011-08-09 NOTE — Progress Notes (Signed)
[facility] Follow-up Outpatient Visit  Shelley Koch 1965-09-28  Date:   Patient Identification: Shelley Koch  Date of Evaluation: 06/28/2011   History of Chief Complaint:  HPI Comments: The patient 46 y/o with Major Depressive Disorder, moderate, reccurrent.  The patient reports that her physical symptoms have worsened which has worsened her mood.  The patient states that she has had several medical evaluations for pain which were negative for any discernible pathology.  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 She states that she made a minor mistake at work which did not result in any adverse event but has her questioning her abilities.   Review of Systems   Psychiatric/Behavioral: Positive for dysphoric mood. Negative for suicidal ideas, behavioral problems, confusion, sleep disturbance, self-injury and agitation.   Depressive Symptoms: depressed mood,  helplessness, Fatigue   (Hypo) Manic Symptoms:  Elevated Mood: No  Irritable Mood: No  Grandiosity: No  Distractibility: No  Lability of Mood: No  Delusions: No  Hallucinations: No  Impulsivity: No  Sexually Inappropriate Behavior: No  Financial Extravagance: No  Flight of Ideas: No  Anxiety Symptoms:  Excessive Worry: No  Panic Symptoms: No Agoraphobia: No  Obsessive Compulsive: No  Symptoms: None,  Specific Phobias: Yes-dentist  Social Anxiety: No   Psychotic Symptoms:  Hallucinations: Yes None  Delusions: No  Paranoia: Yes-only when depressed, regarding whether people like her.  Ideas of Reference: No   PTSD Symptoms:  Ever had a traumatic exposure: Yes  Had a traumatic exposure in the last month: No  Re-experiencing: No None  Hypervigilance: No  Hyperarousal: No None  Avoidance: No None  Traumatic Brain Injury: No   Filed Vitals:   08/09/11 1405  BP: 139/67  Pulse: 90  Height: 5\' 10"  (1.778 m)  Weight: 286 lb (129.729 kg)     Physical Exam  Vitals reviewed.    Constitutional: She appears well-developed and well-nourished. No distress.  Skin: She is not diaphoretic.     Past Psychiatric History: Reviewed Diagnosis: MDD   Hospitalizations: One in 2003   Outpatient Care: Since the age of 58   Substance Abuse Care: none   Self-Mutilation: Patient denies   Suicidal Attempts: One attempt 2003.   Violent Behaviors: None    Past Medical History: Reviewed Past Medical History   Diagnosis  Date   .  Anemia  2003   .  Pain following surgery or procedure  1992    History of Loss of Consciousness: No  Seizure History: No  Cardiac History: No   Allergies: Reviewed Allergies   Allergen  Reactions   .  Codeine    .  Erythromycin      All 'mycin' family abx    Current Medications: Reviewed Current Outpatient Prescriptions on File Prior to Visit  Medication Sig Dispense Refill  . buPROPion (WELLBUTRIN XL) 150 MG 24 hr tablet Take 1 tablet (150 mg total) by mouth daily after breakfast.  30 tablet  1  . citalopram (CELEXA) 40 MG tablet Take 1 tablet (40 mg total) by mouth daily.  30 tablet  1  . lisinopril-hydrochlorothiazide (PRINZIDE,ZESTORETIC) 20-25 MG per tablet       . NEXIUM 40 MG capsule       . norethindrone (AYGESTIN) 5 MG tablet       . NUCYNTA 75 MG TABS Take 75 mg by mouth QID.      Marland Kitchen promethazine (PHENERGAN) 25 MG tablet       .  traZODone (DESYREL) 150 MG tablet Take 1 tablet (150 mg total) by mouth at bedtime.  30 tablet  1  . XIFAXAN 550 MG TABS Take 200 mg by mouth 3 (three) times daily.       Marland Kitchen zolpidem (AMBIEN) 10 MG tablet Take 1 tablet (10 mg total) by mouth at bedtime as needed for sleep.  30 tablet  0    Previous Psychotropic Medications: Reviewed Medication   Adderall   Prozac   Paxil   Zoloft   Citalopram   Ambien   Trazodone    Substance Abuse History in the last 12 months: Reviewed Nicotine: Patient denies  Alcohol: Patient denies  Drug: Patient denies   Social History: Reviewed Current Place of  Residence: [place], [place]  Place of Birth: [place], [place]  Family Members: Father and brother.  Marital Status: Divorced  Children: None  Relationships: Father is main source of support.  Education: Financial planner Problems/Performance: Fair  Religious Beliefs/Practices: Pray.  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  Hobbies/Interests: Work-shopping.   Family History: Reviewed Family History   Problem  Relation  Age of Onset   .  Depression  Mother    .  Hypertension  Mother    .  Hyperlipidemia  Father    .  Heart attack  Father    .  Diabetes  Neg Hx    .  Sudden death  Neg Hx     Mental Status Examination/Evaluation: Objective: Appearance: Casual and Well Groomed   Eye Contact:: Good   Speech: Clear and Coherent   Volume: Normal   Mood: "frustrated."   Affect: Appropriate   Thought Process: Coherent, Logical and Loose   Orientation: Full   Thought Content: WDL   Suicidal Thoughts: No   Homicidal Thoughts: No   Judgement: Fair   Insight: Good   Psychomotor Activity: Normal   Akathisia: No   Handed: Right   AIMS (if indicated): None   Assets: Communication Skills  Desire for Improvement  Financial Resources/Insurance  Social Support  Talents/Skills  Transportation  Vocational/Educational    Assessment:  AXIS I  Major Depressive Disorder, Moderate, recurrent   AXIS II  No diagnosis   AXIS III  .  Anemia  2003    .  Pain following surgery or procedure  1992   AXIS IV  other psychosocial or environmental problems   AXIS V  51-60 moderate symptoms    Treatment Plan/Recommendations:  1. Affirm with the patient that the medications are taken as ordered. Patient expressed understanding of how their medications were to be used.  2. Continue the following psychiatric medications as written prior to this appointment/ with the following changes:  a) Continue buproprion XL 150 mg daily    B) Continue Celexa 40 mg  c) Continue zolpidem 10 mg daily  d) Discontinue trazodone, patient has stopped taking this medication.  e) Patient has discontinued seroquel. 3. Therapy: brief supportive therapy provided. Continue current services. The patient will be referred for individual therapy. 4. Risks and benefits, side effects and alternatives discussed with patient, he/she was given an opportunity to ask questions about her medication, illness, and treatment. All current psychiatric medications have been reviewed and discussed with the patient and adjusted as clinically appropriate. The patient has been provided an accurate and updated list of the medications being now prescribed.  5. Patient told to call clinic if any problems occur. Patient advised  to go to ER if she should develop SI/HI, side effects, or if symptoms worsen. Has crisis numbers to call if needed.  6. No labs warranted at this time.  7. The patient was encouraged to keep all PCP and specialty clinic appointments.  8. Patient was instructed to return to clinic in 2 weeks.  9. The patient expressed understanding of the plan and agrees with the plan.  Jacqulyn Cane, MD

## 2011-08-11 ENCOUNTER — Encounter (HOSPITAL_COMMUNITY): Payer: Self-pay | Admitting: Psychiatry

## 2011-08-23 ENCOUNTER — Ambulatory Visit (INDEPENDENT_AMBULATORY_CARE_PROVIDER_SITE_OTHER): Payer: BC Managed Care – PPO | Admitting: Psychiatry

## 2011-08-23 ENCOUNTER — Encounter (HOSPITAL_COMMUNITY): Payer: Self-pay | Admitting: Psychiatry

## 2011-08-23 VITALS — BP 144/84 | HR 90 | Ht 70.0 in | Wt 294.0 lb

## 2011-08-23 DIAGNOSIS — F329 Major depressive disorder, single episode, unspecified: Secondary | ICD-10-CM

## 2011-08-23 DIAGNOSIS — G4700 Insomnia, unspecified: Secondary | ICD-10-CM

## 2011-08-23 DIAGNOSIS — F331 Major depressive disorder, recurrent, moderate: Secondary | ICD-10-CM

## 2011-08-23 MED ORDER — TRAZODONE HCL 150 MG PO TABS: ORAL_TABLET | ORAL | Status: DC

## 2011-08-23 MED ORDER — ZOLPIDEM TARTRATE 10 MG PO TABS: 10.0000 mg | ORAL_TABLET | Freq: Every evening | ORAL | Status: DC | PRN

## 2011-08-23 MED ORDER — CITALOPRAM HYDROBROMIDE 40 MG PO TABS: 40.0000 mg | ORAL_TABLET | Freq: Every day | ORAL | Status: DC

## 2011-08-23 MED ORDER — BUPROPION HCL ER (XL) 150 MG PO TB24: 150.0000 mg | ORAL_TABLET | Freq: Every day | ORAL | Status: DC

## 2011-08-23 NOTE — Progress Notes (Signed)
[facility] Follow-up Outpatient Visit  Shelley Koch [birth year]  Date: 08/23/11  History of Chief Complaint:   HPI Comments: The patient 46 y/o with Major Depressive Disorder, moderate, recurrent. The patient reports that her physical symptoms have not improved but reports she is doing better emotionally. She states she has better energy. She states that her energy has improved over the past 2 weeks. She states that she is feeling better.  The patient reports that she has slept up to 7 hours.  In the area of affective symptoms, patient appears euthymic. Patient denies current suicidal ideation, intent, or plan. Patient denies current homicidal ideation, intent, or plan. Patient denies auditory hallucinations. Patient denies visual hallucinations. Patient denies symptoms of paranoia. Patient states sleep is poor,but states steady improvement to the point that she slept 7 hours last night. Appetite is good.  Energy level is fair. Patient endorses some symptoms of anhedonia. Patient endorses some hopelessness, helplessness, or guilt.  Denies any recent episodes consistent with mania, particularly decreased need for sleep with increased energy, grandiosity, impulsivity, hyperverbal and pressured speech, or increased productivity. She endorses less irritability since initiation of oxcarbazepine.  Denies any previous or recent symptoms consistent with psychosis, particularly auditory or visual hallucinations, thought broadcasting/insertion/withdrawal, or ideas of reference. Also denies excessive worry to the point of physical symptoms as well as any panic attacks. She denies a history of trauma or symptoms consistent with PTSD such as flashbacks, nightmares, hypervigilance, feelings of numbness or inability to connect with others.   She reports she has become less critical of herself with small mistakes at work. She states she is eating a good diet and is soon going to start exercising as  she is concerned about her weight gain.   Filed Vitals:   08/23/11 1449  BP: 144/84  Pulse: 90  Height: 5\' 10"  (1.778 m)  Weight: 294 lb (133.358 kg)    Physical Exam  Vitals reviewed.  Constitutional: She appears well-developed and well-nourished. No distress.  Skin: She is not diaphoretic.   Past Psychiatric History: Reviewed  Diagnosis: MDD   Hospitalizations: One in 2003   Outpatient Care: Since the age of 17   Substance Abuse Care: none   Self-Mutilation: Patient denies   Suicidal Attempts: One attempt 2003.   Violent Behaviors: None    Past Medical History: Reviewed  Past Medical History   Diagnosis  Date   .  Anemia  2003   .  Pain following surgery or procedure  1992    History of Loss of Consciousness: No  Seizure History: No  Cardiac History: No   Allergies: Reviewed  Allergies   Allergen  Reactions   .  Codeine    .  Erythromycin      All 'mycin' family abx    Current Medications: Reviewed Current Outpatient Prescriptions on File Prior to Visit  Medication Sig Dispense Refill  . lisinopril-hydrochlorothiazide (PRINZIDE,ZESTORETIC) 20-25 MG per tablet       . NEXIUM 40 MG capsule       . norethindrone (AYGESTIN) 5 MG tablet       . NUCYNTA 75 MG TABS Take 75 mg by mouth QID.      Marland Kitchen OXcarbazepine (TRILEPTAL) 150 MG tablet Take 150 mg by mouth Every morning.      . promethazine (PHENERGAN) 25 MG tablet       . XIFAXAN 550 MG TABS Take 200 mg by mouth 3 (three) times daily.       Marland Kitchen  DISCONTD: buPROPion (WELLBUTRIN XL) 150 MG 24 hr tablet Take 1 tablet (150 mg total) by mouth daily after breakfast.  30 tablet  1  . DISCONTD: citalopram (CELEXA) 40 MG tablet Take 1 tablet (40 mg total) by mouth daily.  30 tablet  1  . DISCONTD: traZODone (DESYREL) 150 MG tablet Take 1 tablet (150 mg total) by mouth at bedtime.  30 tablet  1  . DISCONTD: zolpidem (AMBIEN) 10 MG tablet Take 1 tablet (10 mg total) by mouth at bedtime as needed for sleep.  30 tablet  0      Previous Psychotropic Medications: Reviewed  Medication   Adderall   Prozac   Paxil   Zoloft   Citalopram   Ambien   Trazodone    Substance Abuse History in the last 12 months: Reviewed  Nicotine: Patient denies  Alcohol: Patient denies  Drug: Patient denies   Social History: Reviewed  Current Place of Residence: [place], [place]  Place of Birth: [place], [place]  Family Members: Father and brother.  Marital Status: Divorced  Children: None  Relationships: Father is main source of support.  Education: Financial planner Problems/Performance: Fair  Religious Beliefs/Practices: Pray.  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  Hobbies/Interests: Work-shopping.   Family History: Reviewed  Family History   Problem  Relation  Age of Onset   .  Depression  Mother    .  Hypertension  Mother    .  Hyperlipidemia  Father    .  Heart attack  Father    .  Diabetes  Neg Hx    .  Sudden death  Neg Hx      Mental Status Examination/Evaluation:  Objective: Appearance: Casual and Well Groomed   Eye Contact:: Good   Speech: Clear and Coherent   Volume: Normal   Mood: "pretty good."   Affect: Appropriate   Thought Process: Coherent, Logical and Loose   Orientation: Full   Thought Content: WDL   Suicidal Thoughts: No   Homicidal Thoughts: No   Judgement: Fair   Insight: Good   Psychomotor Activity: Normal   Akathisia: No   Handed: Right   AIMS (if indicated): None   Assets: Communication Skills  Desire for Improvement  Financial Resources/Insurance  Social Support  Talents/Skills  Transportation  Vocational/Educational    Assessment:  AXIS I  Major Depressive Disorder, Moderate, recurrent   AXIS II  No diagnosis    AXIS III  .  Anemia  2003    .  Pain following surgery or procedure  1992    AXIS IV  other psychosocial or environmental problems   AXIS V  51-60 moderate symptoms     Treatment Plan/Recommendations:  1. Affirm with the patient that the medications are taken as ordered. Patient expressed understanding of how their medications were to be used.  2. Continue the following psychiatric medications as written prior to this appointment with the following changes:  a) Continue bupropion XL 150 mg daily  B) Continue Celexa 40 mg  c) Continue zolpidem 10 mg daily  d) Increase Trazodone 150 mg-one and half tablets. Patient has been sleeping with the combination of Trazodone and Ambien. Will consider decrease an  E) The patient is currently on oxcarbazepine and is doing well emotionally but not physically. I informed the patient that I would continue this medications if the pain clinic did not continue this medication.  3. Therapy: brief supportive  therapy provided. Continue current services. The patient will be referred for individual therapy.  4. Risks and benefits, side effects and alternatives discussed with patient, he/she was given an opportunity to ask questions about her medication, illness, and treatment. All current psychiatric medications have been reviewed and discussed with the patient and adjusted as clinically appropriate. The patient has been provided an accurate and updated list of the medications being now prescribed.  5. Patient told to call clinic if any problems occur. Patient advised to go to ER if she should develop SI/HI, side effects, or if symptoms worsen. Has crisis numbers to call if needed.  6. No labs warranted at this time.  7. The patient was encouraged to keep all PCP and specialty clinic appointments.  8. Patient was instructed to return to clinic in 4 weeks.  9. The patient expressed understanding of the plan and agrees with the plan.   Jacqulyn Cane, MD

## 2011-09-11 ENCOUNTER — Ambulatory Visit (HOSPITAL_COMMUNITY): Payer: Self-pay | Admitting: Psychology

## 2011-09-20 ENCOUNTER — Encounter (HOSPITAL_COMMUNITY): Payer: Self-pay | Admitting: Psychiatry

## 2011-09-20 ENCOUNTER — Ambulatory Visit (INDEPENDENT_AMBULATORY_CARE_PROVIDER_SITE_OTHER): Payer: BC Managed Care – PPO | Admitting: Psychiatry

## 2011-09-20 VITALS — BP 159/82 | HR 92 | Ht 70.0 in | Wt 300.0 lb

## 2011-09-20 DIAGNOSIS — G4700 Insomnia, unspecified: Secondary | ICD-10-CM

## 2011-09-20 DIAGNOSIS — F331 Major depressive disorder, recurrent, moderate: Secondary | ICD-10-CM

## 2011-09-20 MED ORDER — BUPROPION HCL ER (XL) 150 MG PO TB24: 150.0000 mg | ORAL_TABLET | Freq: Every day | ORAL | Status: DC

## 2011-09-20 MED ORDER — CITALOPRAM HYDROBROMIDE 40 MG PO TABS: 40.0000 mg | ORAL_TABLET | Freq: Every day | ORAL | Status: DC

## 2011-09-20 MED ORDER — TRAZODONE HCL 150 MG PO TABS: ORAL_TABLET | ORAL | Status: DC

## 2011-09-20 MED ORDER — ZOLPIDEM TARTRATE 10 MG PO TABS: 10.0000 mg | ORAL_TABLET | Freq: Every evening | ORAL | Status: DC | PRN

## 2011-09-20 NOTE — Progress Notes (Signed)
[facility] Follow-up Outpatient Visit  Shelley Koch 13-Mar-1966  Date:  Date: 09/20/2011  History of Chief Complaint:  HPI Comments: The patient 46 y/o with Major Depressive Disorder, moderate, recurrent. The patient reports that she has been doing well despite several stressors that have occurred at work.  In the area of affective symptoms, patient appears euthymic. Patient denies current suicidal ideation, intent, or plan. Patient denies current homicidal ideation, intent, or plan. Patient denies auditory hallucinations. Patient denies visual hallucinations. Patient denies symptoms of paranoia. Patient states sleep is good , with 7 hours of sleep. Appetite is good. Energy level is fair. Patient endorses fewer symptoms of anhedonia. Patient endorses less hopelessness, helplessness, and guilt.   Denies any recent episodes consistent with mania, particularly decreased need for sleep with increased energy, grandiosity, impulsivity, hyperverbal and pressured speech, or increased productivity. She endorses less irritability since initiation of oxcarbazepine. Denies any previous or recent symptoms consistent with psychosis, particularly auditory or visual hallucinations, thought broadcasting/insertion/withdrawal, or ideas of reference. Also denies excessive worry to the point of physical symptoms as well as any panic attacks. She denies a history of trauma or symptoms consistent with PTSD such as flashbacks, nightmares, hypervigilance, feelings of numbness or inability to connect with others.   Review of Systems  Constitutional: Positive for malaise/fatigue. Negative for fever, chills, weight loss and diaphoresis.  Respiratory: Negative.   Cardiovascular: Negative.   Neurological: Negative for weakness.     Filed Vitals:   09/20/11 1304  BP: 159/82  Pulse: 92  Height: 5\' 10"  (1.778 m)  Weight: 300 lb (136.079 kg)    Physical Exam  Vitals reviewed.  Constitutional: She  appears well-developed and well-nourished. No distress.  Skin: She is not diaphoretic.   Past Psychiatric History: Reviewed  Diagnosis: MDD   Hospitalizations: One in 2003   Outpatient Care: Since the age of 71   Substance Abuse Care: none   Self-Mutilation: Patient denies   Suicidal Attempts: One attempt 2003.   Violent Behaviors: None    Past Medical History: Reviewed  Past Medical History   Diagnosis  Date   .  Anemia  2003   .  Pain following surgery or procedure  1992    History of Loss of Consciousness: No  Seizure History: No  Cardiac History: No   Allergies: Reviewed  Allergies   Allergen  Reactions   .  Codeine    .  Erythromycin      All 'mycin' family abx    Current Medications: Reviewed  Current Outpatient Prescriptions on File Prior to Visit  Medication Sig Dispense Refill  . buPROPion (WELLBUTRIN XL) 150 MG 24 hr tablet Take 1 tablet (150 mg total) by mouth daily after breakfast.  30 tablet  1  . citalopram (CELEXA) 40 MG tablet Take 1 tablet (40 mg total) by mouth daily.  30 tablet  1  . lisinopril-hydrochlorothiazide (PRINZIDE,ZESTORETIC) 20-25 MG per tablet       . NEXIUM 40 MG capsule       . norethindrone (AYGESTIN) 5 MG tablet       . NUCYNTA 75 MG TABS Take 75 mg by mouth QID.      Marland Kitchen OXcarbazepine (TRILEPTAL) 150 MG tablet Take 150 mg by mouth Every morning.      . promethazine (PHENERGAN) 25 MG tablet       . traZODone (DESYREL) 150 MG tablet Take one and a half tablets at bedtime.  45 tablet  1  .  XIFAXAN 550 MG TABS Take 200 mg by mouth 3 (three) times daily.       Marland Kitchen zolpidem (AMBIEN) 10 MG tablet Take 1 tablet (10 mg total) by mouth at bedtime as needed for sleep.  30 tablet  0    Previous Psychotropic Medications: Reviewed  Medication   Adderall   Prozac   Paxil   Zoloft   Citalopram   Ambien   Trazodone     Substance Abuse History in the last 12 months: Reviewed  Caffeine: Patient reports 72 ounces of caffeinated  beverages. Nicotine: Patient denies  Alcohol: Patient denies  Drug: Patient denies    Social History: Reviewed  Current Place of Residence: [place], [place]  Place of Birth: [place], [place]  Family Members: Father and brother.  Marital Status: Divorced  Children: None  Relationships: Father is main source of support.  Education: Financial planner Problems/Performance: Fair  Religious Beliefs/Practices: Pray.  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  Hobbies/Interests: Work-shopping.   Family History: Reviewed  Family History   Problem  Relation  Age of Onset   .  Depression  Mother    .  Hypertension  Mother    .  Hyperlipidemia  Father    .  Heart attack  Father    .  Diabetes  Neg Hx    .  Sudden death  Neg Hx     Mental Status Examination/Evaluation:  Objective: Appearance: Casual and Well Groomed   Eye Contact:: Good   Speech: Clear and Coherent   Volume: Normal   Mood: "decent enough"   Affect: Appropriate   Thought Process: Coherent, Logical and Loose   Orientation: Full   Thought Content: WDL   Suicidal Thoughts: No   Homicidal Thoughts: No   Judgement: Fair   Insight: Good   Psychomotor Activity: Normal   Akathisia: No   Handed: Right   Memory: 3/3 immediate; 2/3 recent  AIMS (if indicated): None   Assets: Communication Skills  Desire for Improvement  Financial Resources/Insurance  Social Support  Talents/Skills  Transportation  Vocational/Educational    Assessment:  AXIS I  Major Depressive Disorder, Moderate, recurrent   AXIS II  No diagnosis    AXIS III  .  Anemia  2003    .  Pain following surgery or procedure  1992    AXIS IV  other psychosocial or environmental problems   AXIS V  51-60 moderate symptoms    Treatment Plan/Recommendations:  1. Affirm with the patient that the medications are taken as ordered. Patient expressed understanding of how their  medications were to be used.  2. Continue the following psychiatric medications as written prior to this appointment with the following changes:  a) Continue bupropion XL 150 mg daily  B) Continue Celexa 40 mg  c) Continue zolpidem 10 mg daily  D) Continue Trazodone 150 mg-one and half tablets. Patient has been sleeping with the combination of Trazodone and Ambien.  E) The patient is currently on oxcarbazepine and is doing well emotionally but not physically. I informed the patient that I would continue this medications if the pain clinic did not continue this medication.  3. Therapy: brief supportive therapy provided. Continue current services. The patient will be referred for individual therapy.  4. Risks and benefits, side effects and alternatives discussed with patient, he/she was given an opportunity to ask questions about her medication, illness, and treatment. All current psychiatric medications  have been reviewed and discussed with the patient and adjusted as clinically appropriate. The patient has been provided an accurate and updated list of the medications being now prescribed.  5. Patient told to call clinic if any problems occur. Patient advised to go to ER if she should develop SI/HI, side effects, or if symptoms worsen. Has crisis numbers to call if needed.  6. No labs warranted at this time.  7. The patient was encouraged to keep all PCP and specialty clinic appointments.  8. Patient was instructed to return to clinic in 6 weeks.  9. The patient expressed understanding of the plan and agrees with the plan.   Jacqulyn Cane, MD

## 2011-10-25 ENCOUNTER — Ambulatory Visit (INDEPENDENT_AMBULATORY_CARE_PROVIDER_SITE_OTHER): Payer: BC Managed Care – PPO | Admitting: Psychiatry

## 2011-10-25 ENCOUNTER — Encounter (HOSPITAL_COMMUNITY): Payer: Self-pay | Admitting: Psychiatry

## 2011-10-25 VITALS — BP 144/65 | HR 95 | Ht 70.0 in | Wt 302.0 lb

## 2011-10-25 DIAGNOSIS — F331 Major depressive disorder, recurrent, moderate: Secondary | ICD-10-CM

## 2011-10-25 DIAGNOSIS — G4700 Insomnia, unspecified: Secondary | ICD-10-CM

## 2011-10-25 MED ORDER — ZOLPIDEM TARTRATE 10 MG PO TABS: 10.0000 mg | ORAL_TABLET | Freq: Every evening | ORAL | Status: DC | PRN

## 2011-10-25 MED ORDER — CITALOPRAM HYDROBROMIDE 40 MG PO TABS: 40.0000 mg | ORAL_TABLET | Freq: Every day | ORAL | Status: DC

## 2011-10-25 MED ORDER — TRAZODONE HCL 150 MG PO TABS: ORAL_TABLET | ORAL | Status: DC

## 2011-10-25 MED ORDER — BUPROPION HCL ER (XL) 150 MG PO TB24: 150.0000 mg | ORAL_TABLET | Freq: Every day | ORAL | Status: DC

## 2011-10-25 NOTE — Progress Notes (Signed)
[facility] Follow-up Outpatient Visit  Shelley Koch 1966/02/13  Date: 10/25/2011  History of Chief Complaint:  HPI Comments: The patient 46 y/o with Major Depressive Disorder, moderate, recurrent. The patient reports continued fatigue and this has been her main stressor.  In the area of affective symptoms, patient appears euthymic. Patient denies current suicidal ideation, intent, or plan. Patient denies current homicidal ideation, intent, or plan. Patient denies auditory hallucinations. Patient denies visual hallucinations. Patient denies symptoms of paranoia. Patient states sleep is good, with 5 hours of sleep. Appetite is good. Energy level is fair. Patient endorses continued symptoms of anhedonia. Patient endorses less hopelessness, helplessness, and guilt.   Denies any recent episodes consistent with mania, particularly decreased need for sleep with increased energy, grandiosity, impulsivity, hyperverbal and pressured speech, or increased productivity. She reports less irritability since initiation of oxcarbazepine. Denies any previous or recent symptoms consistent with psychosis, particularly auditory or visual hallucinations, thought broadcasting/insertion/withdrawal, or ideas of reference. Also denies excessive worry to the point of physical symptoms as well as any panic attacks. She denies a history of trauma or symptoms consistent with PTSD such as flashbacks, nightmares, hypervigilance, feelings of numbness or inability to connect with others.   Review of Systems  Constitutional: Positive for malaise/fatigue. Negative for fever, chills, weight loss and diaphoresis.  Respiratory: Negative.  Cardiovascular: Negative.  Neurological: Negative for weakness.   Filed Vitals:   10/25/11 1335  BP: 144/65  Pulse: 95  Height: 5\' 10"  (1.778 m)  Weight: 302 lb (136.986 kg)   Physical Exam  Vitals reviewed.  Constitutional: She appears well-developed and well-nourished. No  distress.  Skin: She is not diaphoretic.   Past Psychiatric History: Reviewed  Diagnosis: MDD   Hospitalizations: One in 2003   Outpatient Care: Since the age of 59   Substance Abuse Care: none   Self-Mutilation: Patient denies   Suicidal Attempts: One attempt 2003.   Violent Behaviors: None    Past Medical History: Reviewed  Past Medical History  Diagnosis Date  . Anemia 2003  . Depression   . Pain following surgery or procedure 1992    chronic  persistent    History of Loss of Consciousness: No  Seizure History: No  Cardiac History: No   Allergies: Reviewed  Allergies   Allergen  Reactions   .  Codeine    .  Erythromycin      All 'mycin' family abx    Current Medications: Reviewed  Current Outpatient Prescriptions on File Prior to Visit  Medication Sig Dispense Refill  . buPROPion (WELLBUTRIN XL) 150 MG 24 hr tablet Take 1 tablet (150 mg total) by mouth daily after breakfast.  90 tablet  1  . ciprofloxacin (CIPRO) 500 MG tablet Take 500 mg by mouth Twice daily.      . citalopram (CELEXA) 40 MG tablet Take 1 tablet (40 mg total) by mouth daily.  90 tablet  1  . lisinopril-hydrochlorothiazide (PRINZIDE,ZESTORETIC) 20-25 MG per tablet       . NEXIUM 40 MG capsule       . NUCYNTA 75 MG TABS Take 75 mg by mouth QID.      Marland Kitchen OXcarbazepine (TRILEPTAL) 150 MG tablet Take 150 mg by mouth Every morning.      . promethazine (PHENERGAN) 25 MG tablet       . traZODone (DESYREL) 150 MG tablet Take one and a half tablets at bedtime.  45 tablet  1  . XIFAXAN 550 MG TABS Take  200 mg by mouth 3 (three) times daily.       Marland Kitchen zolpidem (AMBIEN) 10 MG tablet Take 1 tablet (10 mg total) by mouth at bedtime as needed for sleep.  30 tablet  1    Previous Psychotropic Medications: Reviewed  Medication   Adderall   Prozac   Paxil   Zoloft   Citalopram   Ambien   Trazodone    Substance Abuse History in the last 12 months: Reviewed  Caffeine: Patient reports 120 ounces of caffeinated  beverages.  Nicotine: Patient denies  Alcohol: Patient denies  Drug: Patient denies   Social History: Reviewed  Current Place of Residence: [place], [place]  Place of Birth: [place], [place]  Family Members: Father and brother.  Marital Status: Divorced  Children: None  Relationships: Father is main source of support.  Education: Financial planner Problems/Performance: Fair  Religious Beliefs/Practices: Pray.  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  Hobbies/Interests: Work-shopping.   Family History: Reviewed  Family History   Problem  Relation  Age of Onset   .  Depression  Mother    .  Hypertension  Mother    .  Hyperlipidemia  Father    .  Heart attack  Father    .  Diabetes  Neg Hx    .  Sudden death  Neg Hx     Mental Status Examination/Evaluation:  Objective: Appearance: Casual and Well Groomed   Eye Contact:: Good   Speech: Clear and Coherent   Volume: Normal   Mood: "not good"   Affect: Appropriate   Thought Process: Coherent, Logical and Loose   Orientation: Full   Thought Content: WDL   Suicidal Thoughts: No   Homicidal Thoughts: No   Judgement: Fair   Insight: Good   Psychomotor Activity: Normal   Akathisia: No   Handed: Right   Memory: 3/3 immediate; 3/3 recent   AIMS (if indicated): None   Assets: Communication Skills  Desire for Improvement  Financial Resources/Insurance  Social Support  Talents/Skills  Transportation  Vocational/Educational    Assessment:  AXIS I  Major Depressive Disorder, Moderate, recurrent   AXIS II  No diagnosis    AXIS III  .  Anemia  2003    .  Pain following surgery or procedure  1992    AXIS IV  other psychosocial or environmental problems   AXIS V  51-60 moderate symptoms    Treatment Plan/Recommendations:  1. Affirm with the patient that the medications are taken as ordered. Patient expressed understanding of how their medications  were to be used.  2. Continue the following psychiatric medications as written prior to this appointment with the following changes:  a) Continue bupropion XL 150 mg daily  B) Continue Celexa 40 mg  c) Continue zolpidem 10 mg daily  D) Continue Trazodone 150 mg-one and half tablets. Patient has been sleeping with the combination of Trazodone and Ambien.  E) The patient is currently on oxcarbazepine and is doing well emotionally but not physically. I informed the patient that I would continue this medications if the pain clinic did not continue this medication.  3. Therapy: brief supportive therapy provided. Discussed psychosocial stressors. The patient reports that due to work she would be able to take part in individual therapy. 4. Risks and benefits, side effects and alternatives discussed with patient, she was given an opportunity to ask questions about her medication, illness, and treatment. All  current psychiatric medications have been reviewed and discussed with the patient and adjusted as clinically appropriate. The patient has been provided an accurate and updated list of the medications being now prescribed.  5. Patient told to call clinic if any problems occur. Patient advised to go to ER if she should develop SI/HI, side effects, or if symptoms worsen. Has crisis numbers to call if needed.  6. Will order CBC with differential, CMP, Hba1c, Lipid panel, thyroid panel, and oxcarbazepine level (this has not been done by prescribing physician, to rule out medical causes of fatigue. 7. The patient was encouraged to keep all PCP and specialty clinic appointments.  8. Patient was instructed to return to clinic in 4 weeks.  9. The patient expressed understanding of the plan and agrees with the plan.     Jacqulyn Cane, MD

## 2011-11-01 ENCOUNTER — Telehealth (HOSPITAL_COMMUNITY): Payer: Self-pay | Admitting: Psychiatry

## 2011-11-01 ENCOUNTER — Ambulatory Visit (HOSPITAL_COMMUNITY): Payer: Self-pay | Admitting: Psychiatry

## 2011-11-01 LAB — COMPLETE METABOLIC PANEL WITH GFR
ALT: 33 U/L (ref 0–35)
AST: 20 U/L (ref 0–37)
Alkaline Phosphatase: 61 U/L (ref 39–117)
GFR, Est Non African American: >89 mL/min
Glucose, Bld: 201 mg/dL — ABNORMAL HIGH (ref 70–99)
Sodium: 138 mEq/L (ref 135–145)
Total Bilirubin: 0.2 mg/dL — ABNORMAL LOW (ref 0.3–1.2)
Total Protein: 6.1 g/dL (ref 6.0–8.3)

## 2011-11-01 LAB — CBC WITH DIFFERENTIAL/PLATELET
Basophils Absolute: 0 10*3/uL (ref 0.0–0.1)
Basophils Relative: 0 % (ref 0–1)
Eosinophils Absolute: 0 10*3/uL (ref 0.0–0.7)
MCH: 31.6 pg (ref 26.0–34.0)
MCHC: 34.4 g/dL (ref 30.0–36.0)
Neutro Abs: 4.2 10*3/uL (ref 1.7–7.7)
Neutrophils Relative %: 70 % (ref 43–77)
Platelets: 280 10*3/uL (ref 150–400)
RDW: 13.7 % (ref 11.5–15.5)

## 2011-11-01 NOTE — Telephone Encounter (Signed)
Called patient to report that only her CBC had returned back at 6:13 PM. Left Message on her work phone and message of returned results on personal phone number.

## 2011-11-05 ENCOUNTER — Telehealth (HOSPITAL_COMMUNITY): Payer: Self-pay | Admitting: Psychiatry

## 2011-11-05 DIAGNOSIS — F331 Major depressive disorder, recurrent, moderate: Secondary | ICD-10-CM

## 2011-11-05 MED ORDER — CITALOPRAM HYDROBROMIDE 40 MG PO TABS: 40.0000 mg | ORAL_TABLET | Freq: Every day | ORAL | Status: DC

## 2011-11-05 MED ORDER — BUPROPION HCL ER (XL) 150 MG PO TB24: 150.0000 mg | ORAL_TABLET | Freq: Two times a day (BID) | ORAL | Status: DC

## 2011-11-05 NOTE — Telephone Encounter (Signed)
Called patient regarding labs

## 2011-11-08 ENCOUNTER — Telehealth (HOSPITAL_COMMUNITY): Payer: Self-pay | Admitting: Psychiatry

## 2011-11-08 NOTE — Telephone Encounter (Signed)
Called patient and let her know her labs would be available in the clinic in this provider's mail.

## 2011-11-22 ENCOUNTER — Encounter (HOSPITAL_COMMUNITY): Payer: Self-pay | Admitting: Psychiatry

## 2011-11-22 ENCOUNTER — Ambulatory Visit (INDEPENDENT_AMBULATORY_CARE_PROVIDER_SITE_OTHER): Payer: BC Managed Care – PPO | Admitting: Psychiatry

## 2011-11-22 VITALS — BP 130/69 | HR 98 | Ht 70.0 in | Wt 297.0 lb

## 2011-11-22 DIAGNOSIS — F331 Major depressive disorder, recurrent, moderate: Secondary | ICD-10-CM

## 2011-11-22 MED ORDER — BUPROPION HCL ER (XL) 150 MG PO TB24: 450.0000 mg | ORAL_TABLET | ORAL | Status: DC

## 2011-11-22 NOTE — Progress Notes (Signed)
[facility] Follow-up Outpatient Visit  Shelley Koch 1965/12/19  Date: 11/22/2011  History of Chief Complaint:  HPI Comments: The patient 46 y/o with Major Depressive Disorder, moderate, recurrent. The patient reports continued fatigue and this has been her main stressor.  She reports some new medical problems and reports frustration in the time it takes to receive surgical treatment.   In the area of affective symptoms, patient appears mildly depressed. Patient denies current suicidal ideation, intent, or plan. Patient denies current homicidal ideation, intent, or plan. Patient denies auditory hallucinations. Patient denies visual hallucinations. Patient denies symptoms of paranoia. Patient states sleep is good, with 6 hours of sleep. Appetite is good. Energy level is fair. Patient endorses continued symptoms of anhedonia. Patient endorses less hopelessness, helplessness, and guilt.   Denies any recent episodes consistent with mania, particularly decreased need for sleep with increased energy, grandiosity, impulsivity, hyperverbal and pressured speech, or increased productivity. She reports less irritability since initiation of oxcarbazepine. Denies any previous or recent symptoms consistent with psychosis, particularly auditory or visual hallucinations, thought broadcasting/insertion/withdrawal, or ideas of reference. Also denies excessive worry to the point of physical symptoms as well as any panic attacks. She denies a history of trauma or symptoms consistent with PTSD such as flashbacks, nightmares, hypervigilance, feelings of numbness or inability to connect with others.   Review of Systems  Constitutional: Positive for malaise/fatigue. Negative for fever, chills, weight loss and diaphoresis.  Respiratory: Negative.  Cardiovascular: Negative.  Neurological: Negative for weakness.   Filed Vitals:   11/22/11 1536  BP: 130/69  Pulse: 98  Height: 5\' 10"  (1.778 m)  Weight:  297 lb (134.718 kg)   Physical Exam  Vitals reviewed.  Constitutional: She appears well-developed and well-nourished. No distress.  Skin: She is not diaphoretic.   Past Psychiatric History: Reviewed  Diagnosis: MDD   Hospitalizations: One in 2003   Outpatient Care: Since the age of 23   Substance Abuse Care: none   Self-Mutilation: Patient denies   Suicidal Attempts: One attempt 2003.   Violent Behaviors: None    Past Medical History: Reviewed  Past Medical History   Diagnosis  Date   .  Anemia  2003   .  Depression    .  Pain following surgery or procedure  1992     chronic persistent    History of Loss of Consciousness: No  Seizure History: No  Cardiac History: No   Allergies: Reviewed  Allergies   Allergen  Reactions   .  Codeine    .  Erythromycin      All 'mycin' family abx    Current Medications: Reviewed Current Outpatient Prescriptions on File Prior to Visit  Medication Sig Dispense Refill  . buPROPion (WELLBUTRIN XL) 150 MG 24 hr tablet Take 1 tablet (150 mg total) by mouth 2 (two) times daily.  60 tablet  1  . LYRICA 50 MG capsule Take 100 mg by mouth at bedtime.        Previous Psychotropic Medications: Reviewed  Medication   Adderall   Prozac   Paxil   Zoloft   Citalopram   Ambien   Trazodone    Substance Abuse History in the last 12 months: Reviewed  Caffeine: Patient reports 120 ounces of caffeinated beverages.  Nicotine: Patient denies  Alcohol: Patient denies  Drug: Patient denies   Social History: Reviewed  Current Place of Residence: [place], [place]  Place of Birth: [place], [place]  Family Members: Father and brother.  Marital Status: Divorced  Children: None  Relationships: Father is main source of support.  Education: Financial planner Problems/Performance: Fair  Religious Beliefs/Practices: Pray.  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    Hobbies/Interests: Work-shopping.   Family History: Reviewed  Family History   Problem  Relation  Age of Onset   .  Depression  Mother    .  Hypertension  Mother    .  Hyperlipidemia  Father    .  Heart attack  Father    .  Diabetes  Neg Hx    .  Sudden death  Neg Hx     Mental Status Examination/Evaluation:  Objective: Appearance: Casual and Well Groomed   Eye Contact:: Good   Speech: Clear and Coherent   Volume: Normal   Mood: "not good"   Affect: Appropriate   Thought Process: Coherent, Logical and Loose   Orientation: Full   Thought Content: WDL   Suicidal Thoughts: No   Homicidal Thoughts: No   Judgement: Fair   Insight: Good   Psychomotor Activity: Normal   Akathisia: No   Handed: Right   Memory: 3/3 immediate; 3/3 recent    AIMS (if indicated): None   Assets: Communication Skills  Desire for Improvement  Financial Resources/Insurance  Social Support  Talents/Skills  Transportation  Vocational/Educational    Assessment:  AXIS I  Major Depressive Disorder, Moderate, recurrent   AXIS II  No diagnosis    AXIS III  .  Anemia  2003    .  Pain following surgery or procedure  1992    AXIS IV  other psychosocial or environmental problems   AXIS V  51-60 moderate symptoms    Treatment Plan/Recommendations:  1. Affirm with the patient that the medications are taken as ordered. Patient expressed understanding of how their medications were to be used.  2. Continue the following psychiatric medications as written prior to this appointment with the following changes:  a) Increase bupropion XL 450 mg daily  B) Continue Celexa 40 mg  c) Continue zolpidem 10 mg daily  D) Continue Trazodone 150 mg-one and half tablets. Patient has been sleeping with the combination of Trazodone and Ambien.  E) The patient is currently on oxcarbazepine and is doing well emotionally but not physically. I informed the patient that I would continue this medications if the pain clinic did  not continue this medication.  3. Therapy: brief supportive therapy provided. Discussed psychosocial stressors. The patient reports that due to work she would be able to take part in individual therapy.  4. Risks and benefits, side effects and alternatives discussed with patient, she was given an opportunity to ask questions about her medication, illness, and treatment. All current psychiatric medications have been reviewed and discussed with the patient and adjusted as clinically appropriate. The patient has been provided an accurate and updated list of the medications being now prescribed.  5. Patient told to call clinic if any problems occur. Patient advised to go to ER if she should develop SI/HI, side effects, or if symptoms worsen. Has crisis numbers to call if needed.  6. Reviewed labs with the patient. 7. The patient was encouraged to keep all PCP and specialty clinic appointments.  8. Patient was instructed to return to clinic in 4 weeks.  9. The patient expressed understanding of the plan and agrees with the plan.    Jacqulyn Cane, MD

## 2011-12-24 ENCOUNTER — Ambulatory Visit (INDEPENDENT_AMBULATORY_CARE_PROVIDER_SITE_OTHER): Payer: BC Managed Care – PPO | Admitting: Psychiatry

## 2011-12-24 ENCOUNTER — Encounter (HOSPITAL_COMMUNITY): Payer: Self-pay | Admitting: Psychiatry

## 2011-12-24 VITALS — BP 138/95 | HR 98 | Ht 70.0 in

## 2011-12-24 DIAGNOSIS — F331 Major depressive disorder, recurrent, moderate: Secondary | ICD-10-CM

## 2011-12-24 MED ORDER — TRAZODONE HCL 150 MG PO TABS: 150.0000 mg | ORAL_TABLET | Freq: Every day | ORAL | Status: AC

## 2011-12-24 MED ORDER — ARIPIPRAZOLE 5 MG PO TABS: 5.0000 mg | ORAL_TABLET | Freq: Every day | ORAL | Status: AC

## 2011-12-24 MED ORDER — BUPROPION HCL ER (XL) 150 MG PO TB24: 450.0000 mg | ORAL_TABLET | ORAL | Status: AC

## 2011-12-24 NOTE — Progress Notes (Signed)
[facility] Follow-up Outpatient Visit  Shelley Koch 09-27-1965  Shelley Koch  December 16, 1965  Date: 11/22/2011  History of Chief Complaint:  HPI Comments: The patient 46 y/o with Major Depressive Disorder, moderate, recurrent. The patient reports continued fatigue and this has been her main stressor. She reports some new medical problems and reports frustration in the time it takes to receive surgical treatment. She endorses health and work related stressors that have caused her to be upset this week. She was told she could not have further weight reduction surgery secondary to depression.  In the area of affective symptoms, patient appears  depressed. Patient denies current suicidal ideation, intent, or plan. Patient denies current homicidal ideation, intent, or plan. Patient denies auditory hallucinations. Patient denies visual hallucinations. Patient denies symptoms of paranoia. Patient states sleep is good, with 6 hours of sleep. Appetite is good. Energy level is fair. Patient endorses continued symptoms of anhedonia. Patient endorses less hopelessness, helplessness, and guilt.   Denies any recent episodes consistent with mania, particularly decreased need for sleep with increased energy, grandiosity, impulsivity, hyperverbal and pressured speech, or increased productivity.  Denies any previous or recent symptoms consistent with psychosis, particularly auditory or visual hallucinations, thought broadcasting/insertion/withdrawal, or ideas of reference. Also denies excessive worry to the point of physical symptoms as well as any panic attacks. She denies a history of trauma or symptoms consistent with PTSD such as flashbacks, nightmares, hypervigilance, feelings of numbness or inability to connect with others.   Review of Systems  Constitutional: Positive for malaise/fatigue. Negative for fever, chills, weight loss and diaphoresis.  Respiratory: Negative.  Cardiovascular: Negative.    Neurological: Negative for weakness.   Filed Vitals:   12/24/11 1423  BP: 138/95  Pulse: 98  Weight Patient refused-last weight was 297 lbs  Height: 5\' 10"  (1.778 m)   Physical Exam  Vitals reviewed.  Constitutional: She appears well-developed and well-nourished. No distress.  Skin: She is not diaphoretic.   Past Psychiatric History: Reviewed  Diagnosis: MDD   Hospitalizations: One in 2003   Outpatient Care: Since the age of 27   Substance Abuse Care: none   Self-Mutilation: Patient denies   Suicidal Attempts: One attempt 2003.   Violent Behaviors: None    Past Medical History: Reviewed  Past Medical History   Diagnosis  Date   .  Anemia  2003   .  Depression    .  Pain following surgery or procedure  1992     chronic persistent    History of Loss of Consciousness: No  Seizure History: No  Cardiac History: No   Allergies: Reviewed  Allergies   Allergen  Reactions   .  Codeine    .  Erythromycin      All 'mycin' family abx   Current Medications: Reviewed  Current Outpatient Prescriptions on File Prior to Visit   Medication  Sig  Dispense  Refill   .  buPROPion (WELLBUTRIN XL) 150 MG 24 hr tablet  Take 1 tablet (150 mg total) by mouth 2 (two) times daily.  60 tablet  1   .  LYRICA 50 MG capsule  Take 100 mg by mouth at bedtime.      Previous Psychotropic Medications: Reviewed  Medication   Adderall   Prozac   Paxil   Zoloft   Citalopram   Ambien   Trazodone    Substance Abuse History in the last 12 months: Reviewed  Caffeine: Patient reports 120 ounces of caffeinated  beverages.  Nicotine: Patient denies  Alcohol: Patient denies  Drug: Patient denies   Social History: Reviewed  Current Place of Residence: [place], [place]  Place of Birth: [place], [place]  Family Members: Father and brother.  Marital Status: Divorced  Children: None  Relationships: Father is main source of support.  Education: Financial planner Problems/Performance: Fair   Religious Beliefs/Practices: Pray.  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  Hobbies/Interests: Work-shopping.   Family History: Reviewed  Family History   Problem  Relation  Age of Onset   .  Depression  Mother    .  Hypertension  Mother    .  Hyperlipidemia  Father    .  Heart attack  Father    .  Diabetes  Neg Hx    .  Sudden death  Neg Hx     Mental Status Examination/Evaluation:  Objective: Appearance: Casual and Well Groomed, tearful  Eye Contact:: Good   Speech: Clear and Coherent   Volume: Normal   Mood: "bad  Affect: Appropriate   Thought Process: Coherent, Logical and Loose   Orientation: Full   Thought Content: WDL   Suicidal Thoughts: No   Homicidal Thoughts: No   Judgement: Fair   Insight: Good   Psychomotor Activity: Normal   Akathisia: No   Handed: Right   Memory: Intact  AIMS (if indicated): None   Assets: Communication Skills  Desire for Improvement  Financial Resources/Insurance  Social Support  Talents/Skills  Transportation  Vocational/Educational    Assessment:  AXIS I  Major Depressive Disorder, Moderate, recurrent   AXIS II  No diagnosis    AXIS III  .  Anemia  2003    .  Pain following surgery or procedure  1992    AXIS IV  other psychosocial or environmental problems   AXIS V  51-60 moderate symptoms    Treatment Plan/Recommendations:  1. Affirm with the patient that the medications are taken as ordered. Patient expressed understanding of how their medications were to be used.  2. Continue the following psychiatric medications as written prior to this appointment with the following changes:  a) Continue bupropion XL 450 mg daily  B) Continue Celexa 40 mg  c) Discontinue zolpidem 10 mg daily  D) Continue Trazodone 150 mg-one and half tablets. Patient has been sleeping with the combination of Trazodone and Ambien.  E) Will initiate Abilify 5 mg- one half tablet  for 3 days then increase to one whole tablet daily. 3. Therapy: brief supportive therapy provided. Discussed psychosocial stressors. The patient reports reports that she will take part in  individual therapy.  4. Risks and benefits, side effects and alternatives discussed with patient, she was given an opportunity to ask questions about her medication, illness, and treatment. All current psychiatric medications have been reviewed and discussed with the patient and adjusted as clinically appropriate. The patient has been provided an accurate and updated list of the medications being now prescribed.  5. Patient told to call clinic if any problems occur. Patient advised to go to ER if she should develop SI/HI, side effects, or if symptoms worsen. Has crisis numbers to call if needed.  6. Reviewed labs with the patient.  7. The patient was encouraged to keep all PCP and specialty clinic appointments.  8. Patient was instructed to return to clinic in 4 weeks.  9. The patient expressed understanding of the plan and agrees with the plan.  Jacqulyn Cane, MD

## 2012-02-10 ENCOUNTER — Ambulatory Visit (HOSPITAL_COMMUNITY): Payer: Self-pay | Admitting: Psychiatry

## 2014-03-06 ENCOUNTER — Encounter: Payer: Self-pay | Admitting: Emergency Medicine

## 2014-03-06 ENCOUNTER — Emergency Department (INDEPENDENT_AMBULATORY_CARE_PROVIDER_SITE_OTHER): Payer: BC Managed Care – PPO

## 2014-03-06 ENCOUNTER — Emergency Department
Admission: EM | Admit: 2014-03-06 | Discharge: 2014-03-06 | Disposition: A | Discharge status: Home / Self Care | Payer: BC Managed Care – PPO | Source: Home / Self Care | Attending: Family Medicine | Admitting: Family Medicine

## 2014-03-06 DIAGNOSIS — T1490XA Injury, unspecified, initial encounter: Secondary | ICD-10-CM

## 2014-03-06 DIAGNOSIS — M79671 Pain in right foot: Secondary | ICD-10-CM

## 2014-03-06 DIAGNOSIS — T1490 Injury, unspecified: Secondary | ICD-10-CM

## 2014-03-06 DIAGNOSIS — S92354A Nondisplaced fracture of fifth metatarsal bone, right foot, initial encounter for closed fracture: Secondary | ICD-10-CM

## 2014-03-06 DIAGNOSIS — S93601A Unspecified sprain of right foot, initial encounter: Secondary | ICD-10-CM

## 2014-03-06 NOTE — ED Provider Notes (Signed)
CSN: [id number]     Arrival date & time 03/06/14  1228 History   First MD Initiated Contact with Patient 03/06/14 1423     Chief Complaint  Patient presents with  . Foot Pain      HPI Comments: Patient tripped last night, falling on asphalt and twisting her right foot.  Patient is a 48 y.o. female presenting with foot injury. The history is provided by the patient.  Foot Injury Location:  Foot Time since incident:  1 day Injury: yes   Mechanism of injury: fall   Fall:    Impact surface: asphalt. Foot location:  R foot Pain details:    Quality:  Aching   Radiates to:  Does not radiate   Severity:  Moderate   Onset quality:  Sudden   Duration:  1 day   Timing:  Constant   Progression:  Unchanged Chronicity:  New Dislocation: no   Foreign body present:  No foreign bodies Prior injury to area:  No Relieved by:  Nothing Worsened by:  Bearing weight Ineffective treatments:  Ice Associated symptoms: decreased ROM, stiffness and swelling   Associated symptoms: no back pain, no muscle weakness, no numbness and no tingling   Risk factors: obesity     Past Medical History  Diagnosis Date  . Anemia 2003  . Depression   . Pain following surgery or procedure 1992    chronic  persistent   Past Surgical History  Procedure Laterality Date  . Pancreaticoduodenectomy  1992  . Laparoscopic lysis intestinal adhesions      after 2003  . Gastric bypass  2003  . Hernia repair     Family History  Problem Relation Age of Onset  . Depression Mother   . Hypertension Mother   . Hyperlipidemia Father   . Heart attack Father   . Coronary artery disease Father   . Diabetes Neg Hx   . Sudden death Neg Hx    History  Substance Use Topics  . Smoking status: Current Every Day Smoker -- 0.30 packs/day for 29 years    Types: Cigarettes  . Smokeless tobacco: Not on file     Comment: 1/2 pack per day  . Alcohol Use: No   OB History    No data available     Review of Systems    Musculoskeletal: Positive for stiffness. Negative for back pain.  All other systems reviewed and are negative.   Allergies  Codeine and Erythromycin  Home Medications   Prior to Admission medications   Medication Sig Start Date End Date Taking? Authorizing Provider  fentaNYL (DURAGESIC - DOSED MCG/HR) 75 MCG/HR Place 25 mcg onto the skin every 3 (three) days.   Yes Historical Provider, MD  HYDROmorphone (DILAUDID) 2 MG tablet Take 5 mg by mouth every 4 (four) hours as needed for severe pain.   Yes Historical Provider, MD  promethazine (PHENERGAN) 25 MG tablet Take 25 mg by mouth every 6 (six) hours as needed for nausea or vomiting.   Yes Historical Provider, MD  ARIPiprazole (ABILIFY) 5 MG tablet Take 1 tablet (5 mg total) by mouth daily. Take one-half tablet for 3 days then increase to one whole tablet daily. 12/24/11 12/23/12  Larena SoxShaji J Puthuvel, MD  buPROPion (WELLBUTRIN XL) 150 MG 24 hr tablet Take 3 tablets (450 mg total) by mouth every morning. 12/24/11   Larena SoxShaji J Puthuvel, MD  cholecalciferol (VITAMIN D) 1000 UNITS tablet Take 4,000 Units by mouth daily.     Historical  Provider, MD  lisinopril-hydrochlorothiazide Marcell Anger(PRINZIDE,ZESTORETIC) 20-25 MG per tablet  11/08/11   Historical Provider, MD  NEXIUM 40 MG capsule  10/20/11   Historical Provider, MD  NUCYNTA 75 MG TABS Take 500 mg by mouth daily.  11/08/11   Historical Provider, MD  traZODone (DESYREL) 150 MG tablet Take 1 tablet (150 mg total) by mouth at bedtime. Take one to one and a half tablets daily. 12/24/11   Larena SoxShaji J Puthuvel, MD  zolpidem (AMBIEN) 10 MG tablet Take 10 mg by mouth at bedtime as needed.  10/29/11   Historical Provider, MD   BP 143/86 mmHg  Pulse 76  Temp(Src) 98.1 F (36.7 C) (Oral)  Resp 16  Ht 5\' 10"  (1.778 m)  Wt 250 lb (113.399 kg)  BMI 35.87 kg/m2  SpO2 100% Physical Exam  Constitutional: She is oriented to person, place, and time. She appears well-developed and well-nourished. No distress.  Patient is obese  (BMI 35.9)  HENT:  Head: Atraumatic.  Eyes: Pupils are equal, round, and reactive to light.  Musculoskeletal:       Right foot: There is decreased range of motion, tenderness and bony tenderness. There is no swelling, normal capillary refill, no crepitus, no deformity and no laceration.       Feet:  Right foot reveals tenderness dorsally as noted on diagram, and over base of the fifth metatarsal.  Distal neurovascular function is intact.     Neurological: She is alert and oriented to person, place, and time.  Skin: Skin is warm and dry.  Nursing note and vitals reviewed.   ED Course  Procedures  none    Imaging Review Dg Foot Complete Right  03/06/2014   CLINICAL DATA:  Fall yesterday with fifth metatarsal pain probable initial encounter  EXAM: RIGHT FOOT COMPLETE - 3+ VIEW  COMPARISON:  None.  FINDINGS: Very mild cortical irregularity is noted at the base of the fifth metatarsal at its articulation with cuboid bone. This could represent a small cortical fracture. No other focal abnormality is seen. Small calcaneal spurs are noted.  IMPRESSION: Questionable cortical fracture at the base of the fifth metatarsal. Correlation to point tenderness is recommended.   Electronically Signed   By: Alcide CleverMark  Lukens M.D.   On: 03/06/2014 14:10     MDM   1. Nondisplaced fracture of fifth right metatarsal bone, closed, initial encounter   2. Injury   3. Right foot sprain, initial encounter    Ace wrap applied.  Dispensed crutches and post-op shoe. Apply ice pack for 30 minutes every 1 to 2 hours today and tomorrow.  Elevate.  Use crutches for 3 to 5 days.  Wear Ace wrap until swelling decreases.   Wear post-op shoe for about 2 to 3 weeks.  Begin range of motion and stretching exercises in about 5 days as per instruction sheet. Followup with orthopedist if not improved two weeks.    Lattie HawStephen A Beese, MD 03/06/14 [phone]

## 2014-03-06 NOTE — ED Notes (Signed)
Reports falling and hurting right foot last night.

## 2014-03-06 NOTE — Discharge Instructions (Signed)
Apply ice pack for 30 minutes every 1 to 2 hours today and tomorrow.  Elevate.  Use crutches for 3 to 5 days.  Wear Ace wrap until swelling decreases.   Wear post-op shoe for about 2 to 3 weeks.  Begin range of motion and stretching exercises in about 5 days as per instruction sheet.    Foot Sprain The muscles and cord like structures which attach muscle to bone (tendons) that surround the feet are made up of units. A foot sprain can occur at the weakest spot in any of these units. This condition is most often caused by injury to or overuse of the foot, as from playing contact sports, or aggravating a previous injury, or from poor conditioning, or obesity. SYMPTOMS  Pain with movement of the foot.  Tenderness and swelling at the injury site.  Loss of strength is present in moderate or severe sprains. THE THREE GRADES OR SEVERITY OF FOOT SPRAIN ARE:  Mild (Grade I): Slightly pulled muscle without tearing of muscle or tendon fibers or loss of strength.  Moderate (Grade II): Tearing of fibers in a muscle, tendon, or at the attachment to bone, with small decrease in strength.  Severe (Grade III): Rupture of the muscle-tendon-bone attachment, with separation of fibers. Severe sprain requires surgical repair. Often repeating (chronic) sprains are caused by overuse. Sudden (acute) sprains are caused by direct injury or over-use. DIAGNOSIS  Diagnosis of this condition is usually by your own observation. If problems continue, a caregiver may be required for further evaluation and treatment. X-rays may be required to make sure there are not breaks in the bones (fractures) present. Continued problems may require physical therapy for treatment. PREVENTION  Use strength and conditioning exercises appropriate for your sport.  Warm up properly prior to working out.  Use athletic shoes that are made for the sport you are participating in.  Allow adequate time for healing. Early return to activities  makes repeat injury more likely, and can lead to an unstable arthritic foot that can result in prolonged disability. Mild sprains generally heal in 3 to 10 days, with moderate and severe sprains taking 2 to 10 weeks. Your caregiver can help you determine the proper time required for healing. HOME CARE INSTRUCTIONS   Apply ice to the injury for 15-20 minutes, 03-04 times per day. Put the ice in a plastic bag and place a towel between the bag of ice and your skin.  An elastic wrap (like an Ace bandage) may be used to keep swelling down.  Keep foot above the level of the heart, or at least raised on a footstool, when swelling and pain are present.  Try to avoid use other than gentle range of motion while the foot is painful. Do not resume use until instructed by your caregiver. Then begin use gradually, not increasing use to the point of pain. If pain does develop, decrease use and continue the above measures, gradually increasing activities that do not cause discomfort, until you gradually achieve normal use.  Use crutches if and as instructed, and for the length of time instructed.  Keep injured foot and ankle wrapped between treatments.  Massage foot and ankle for comfort and to keep swelling down. Massage from the toes up towards the knee.  Only take over-the-counter or prescription medicines for pain, discomfort, or fever as directed by your caregiver. SEEK IMMEDIATE MEDICAL CARE IF:   Your pain and swelling increase, or pain is not controlled with medications.  You have  loss of feeling in your foot or your foot turns cold or blue.  You develop new, unexplained symptoms, or an increase of the symptoms that brought you to your caregiver. MAKE SURE YOU:   Understand these instructions.  Will watch your condition.  Will get help right away if you are not doing well or get worse. Document Released: 08/31/2001 Document Revised: 06/03/2011 Document Reviewed: 10/29/2007 [facility] Patient  Information 2015 [employer], [place]. This information is not intended to replace advice given to you by your health care provider. Make sure you discuss any questions you have with your health care provider.

## 2015-04-09 ENCOUNTER — Emergency Department
Admission: EM | Admit: 2015-04-09 | Discharge: 2015-04-09 | Disposition: A | Discharge status: Home / Self Care | Payer: 59 | Source: Home / Self Care | Attending: Family Medicine | Admitting: Family Medicine

## 2015-04-09 ENCOUNTER — Emergency Department (INDEPENDENT_AMBULATORY_CARE_PROVIDER_SITE_OTHER): Payer: 59

## 2015-04-09 DIAGNOSIS — M25572 Pain in left ankle and joints of left foot: Secondary | ICD-10-CM | POA: Diagnosis not present

## 2015-04-09 DIAGNOSIS — M79642 Pain in left hand: Secondary | ICD-10-CM

## 2015-04-09 DIAGNOSIS — W010XXA Fall on same level from slipping, tripping and stumbling without subsequent striking against object, initial encounter: Secondary | ICD-10-CM

## 2015-04-09 DIAGNOSIS — W1839XA Other fall on same level, initial encounter: Secondary | ICD-10-CM

## 2015-04-09 DIAGNOSIS — S63502A Unspecified sprain of left wrist, initial encounter: Secondary | ICD-10-CM | POA: Diagnosis not present

## 2015-04-09 HISTORY — DX: Essential (primary) hypertension: I10

## 2015-04-09 NOTE — ED Notes (Signed)
Fell off treadmill yesterday, and caught herself with left wrist.  Went to [employer] to get brace for wrist and stepped in pot hole and twisted left ankle.  Wrist with soreness and ankle painful when flexing.

## 2015-04-09 NOTE — Discharge Instructions (Signed)
You may take acetaminophen and ibuprofen to help with pain and swell as well as take your regular chronic pain medication.

## 2015-04-09 NOTE — ED Provider Notes (Signed)
CSN: [id number]     Arrival date & time 04/09/15  1704 History   First MD Initiated Contact with Patient 04/09/15 1711     Chief Complaint  Patient presents with  . Foot Pain  . Hand Pain   (Consider location/radiation/quality/duration/timing/severity/associated sxs/prior Treatment) HPI  Pt is a 50yo female presenting to [facility] with c/o Left hand and wrist pain that started yesterday after she fell off her treadmill, then she developed Left ankle pain after she tripped in the parking lot going to get a splint for her Left hand.  Pain in hand and wrist is aching and throbbing, worse with certain movements, 3/10 at this time.  Pt is Right hand dominant.  Pt also c/o Left ankle pain that is aching and sore, mild in severity.  Pt did rest today and has been taking her regular chronic pain medication, which does provide relief.  Pt is leaving for [facility] tomorrow and wants to make sure she does not have any broken bones.  Denies hitting her head during either fall.   Past Medical History  Diagnosis Date  . Anemia 2003  . Depression   . 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 Hypertension    Past Surgical History  Procedure Laterality Date  . Pancreaticoduodenectomy  1992  . Laparoscopic lysis intestinal adhesions      after 2003  . Gastric bypass  2003  . Hernia repair    . Tonsillectomy     Family History  Problem Relation Age of Onset  . Depression Mother   . Hypertension Mother   . Hyperlipidemia Father   . Heart attack Father   . Coronary artery disease Father   . Diabetes Neg Hx   . Sudden death Neg Hx    Social History  Substance Use Topics  . Smoking status: Current Every Day Smoker -- 0.30 packs/day for 29 years    Types: Cigarettes  . Smokeless tobacco: None     Comment: 1/2 pack per day  . Alcohol Use: No   OB History    No data available     Review of Systems  Musculoskeletal: Positive for myalgias, joint swelling, arthralgias and gait  problem. Negative for back pain, neck pain and neck stiffness.  Skin: Negative for color change and wound.  Neurological: Positive for weakness. Negative for numbness.    Allergies  Codeine; Erythromycin; and Morphine and related  Home Medications   Prior to Admission medications   Medication Sig Start Date End Date Taking? Authorizing Provider  ARIPiprazole (ABILIFY) 5 MG tablet Take 1 tablet (5 mg total) by mouth daily. Take one-half tablet for 3 days then increase to one whole tablet daily. 12/24/11 12/23/12  Larena Sox, MD  buPROPion (WELLBUTRIN XL) 150 MG 24 hr tablet Take 3 tablets (450 mg total) by mouth every morning. 12/24/11   Larena Sox, MD  cholecalciferol (VITAMIN D) 1000 UNITS tablet Take 4,000 Units by mouth daily.     Historical Provider, MD  fentaNYL (DURAGESIC - DOSED MCG/HR) 75 MCG/HR Place 25 mcg onto the skin every 3 (three) days.    Historical Provider, MD  HYDROmorphone (DILAUDID) 2 MG tablet Take 5 mg by mouth every 4 (four) hours as needed for severe pain.    Historical Provider, MD  lisinopril-hydrochlorothiazide Marcell Anger) 20-25 MG per tablet  11/08/11   Historical Provider, MD  NEXIUM 40 MG capsule  10/20/11   Historical Provider, MD  NUCYNTA 75  MG TABS Take 500 mg by mouth daily.  11/08/11   Historical Provider, MD  promethazine (PHENERGAN) 25 MG tablet Take 25 mg by mouth every 6 (six) hours as needed for nausea or vomiting.    Historical Provider, MD  traZODone (DESYREL) 150 MG tablet Take 1 tablet (150 mg total) by mouth at bedtime. Take one to one and a half tablets daily. 12/24/11   Larena SoxShaji J Puthuvel, MD  zolpidem (AMBIEN) 10 MG tablet Take 10 mg by mouth at bedtime as needed.  10/29/11   Historical Provider, MD   Meds Ordered and Administered this Visit  Medications - No data to display  Pulse 96  Temp(Src) 98.1 F (36.7 C) (Oral)  Ht 5\' 10"  (1.778 m)  Wt 270 lb (122.471 kg)  BMI 38.74 kg/m2  SpO2 97% No data found.   Physical Exam   Constitutional: She is oriented to person, place, and time. She appears well-developed and well-nourished.  HENT:  Head: Normocephalic and atraumatic.  Eyes: EOM are normal.  Neck: Normal range of motion.  Cardiovascular: Normal rate.   Pulses:      Radial pulses are 2+ on the left side.       Dorsalis pedis pulses are 2+ on the left side.  Pulmonary/Chest: Effort normal.  Musculoskeletal: Normal range of motion. She exhibits edema and tenderness.  Left wrist: mild edema, circumferential tenderness, worse on ulnar aspect.  Mild snuff box tenderness. Full ROM wrist and fingers. 4/5 grip strength compared to Right hand.  Left ankle: no edema, mild tenderness to medial aspect. Full ROM ankle and toes. No calf tenderness.   Neurological: She is alert and oriented to person, place, and time.  Normal sensation in Left hand and Left foot.  Skin: Skin is warm and dry.  Left wrist, hand and ankle: skin in tact. No ecchymosis, erythema or warmth.   Psychiatric: She has a normal mood and affect. Her behavior is normal.  Nursing note and vitals reviewed.   ED Course  Procedures (including critical care time)  Labs Review Labs Reviewed - No data to display  Imaging Review Dg Wrist Complete Left  04/09/2015  CLINICAL DATA:  Left hand injury after falling off a treadmill yesterday. Initial encounter. EXAM: LEFT WRIST - COMPLETE 3+ VIEW COMPARISON:  None. FINDINGS: The mineralization and alignment are normal. There is no evidence of acute fracture or dislocation. The joint spaces are maintained. No focal soft tissue swelling or foreign bodies identified. IMPRESSION: No acute osseous findings. Electronically Signed   By: Carey BullocksWilliam  Veazey M.D.   On: 04/09/2015 18:08   Dg Ankle Complete Left  04/09/2015  CLINICAL DATA:  Anterior ankle pain after falling from a treadmill yesterday. EXAM: LEFT ANKLE COMPLETE - 3+ VIEW COMPARISON:  None. FINDINGS: The mineralization and alignment are normal. There is no  evidence of acute fracture or dislocation. The joint spaces are maintained. No focal soft tissue swelling identified. There are calcaneal spurs. IMPRESSION: No acute osseous findings. Electronically Signed   By: Carey BullocksWilliam  Veazey M.D.   On: 04/09/2015 18:11   Dg Hand Complete Left  04/09/2015  CLINICAL DATA:  Left hand injury after falling off a treadmill yesterday. Initial encounter. EXAM: LEFT HAND - COMPLETE 3+ VIEW COMPARISON:  None. FINDINGS: The mineralization and alignment are normal. There is no evidence of acute fracture or dislocation. The joint spaces are maintained. No focal soft tissue swelling or foreign bodies demonstrated. IMPRESSION: No acute osseous findings. Electronically Signed   By: Chrissie NoaWilliam  Purcell Mouton M.D.   On: 04/09/2015 18:07     MDM   1. Fall from slip, trip, or stumble, initial encounter   2. Left hand pain   3. Left wrist sprain, initial encounter   4. Left ankle pain    Pt c/o Left wrist, hand and ankle pain.  PMS in tact in Left hand and Left ankle/foot.   Plain films: negative for fractures or dislocations. Will treat as sprains.  Left hand/wrist placed in thumb spice splint.   Encouraged to use ice and elevated. F/u with Dr. Denyse Amass, Sports Medicine in 1-2 weeks if not improving, sooner if worsening. Patient verbalized understanding and agreement with treatment plan.     Junius Finner, PA-C 04/09/15 1824

## 2016-01-24 IMAGING — CR DG FOOT COMPLETE 3+V*R*
3 series · 3 of 3 positions shown · non-contrast
Comparison: None.

CLINICAL DATA: Fall yesterday with fifth metatarsal pain probable
initial encounter

EXAM:
RIGHT FOOT COMPLETE - 3+ VIEW

[view not recorded (1 of 3)]
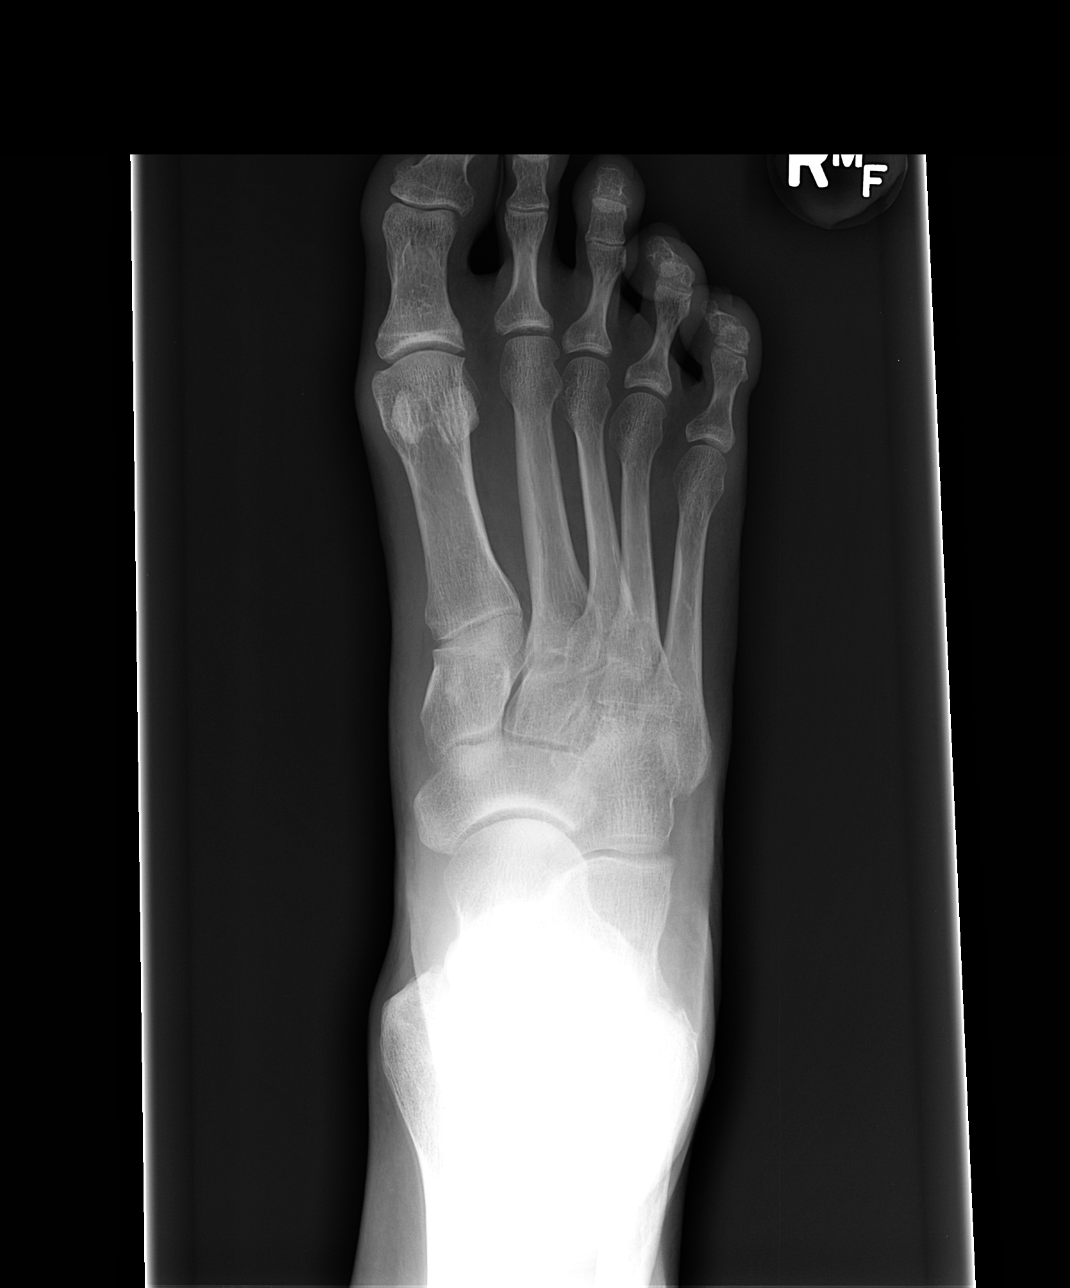

[view not recorded (2 of 3)]
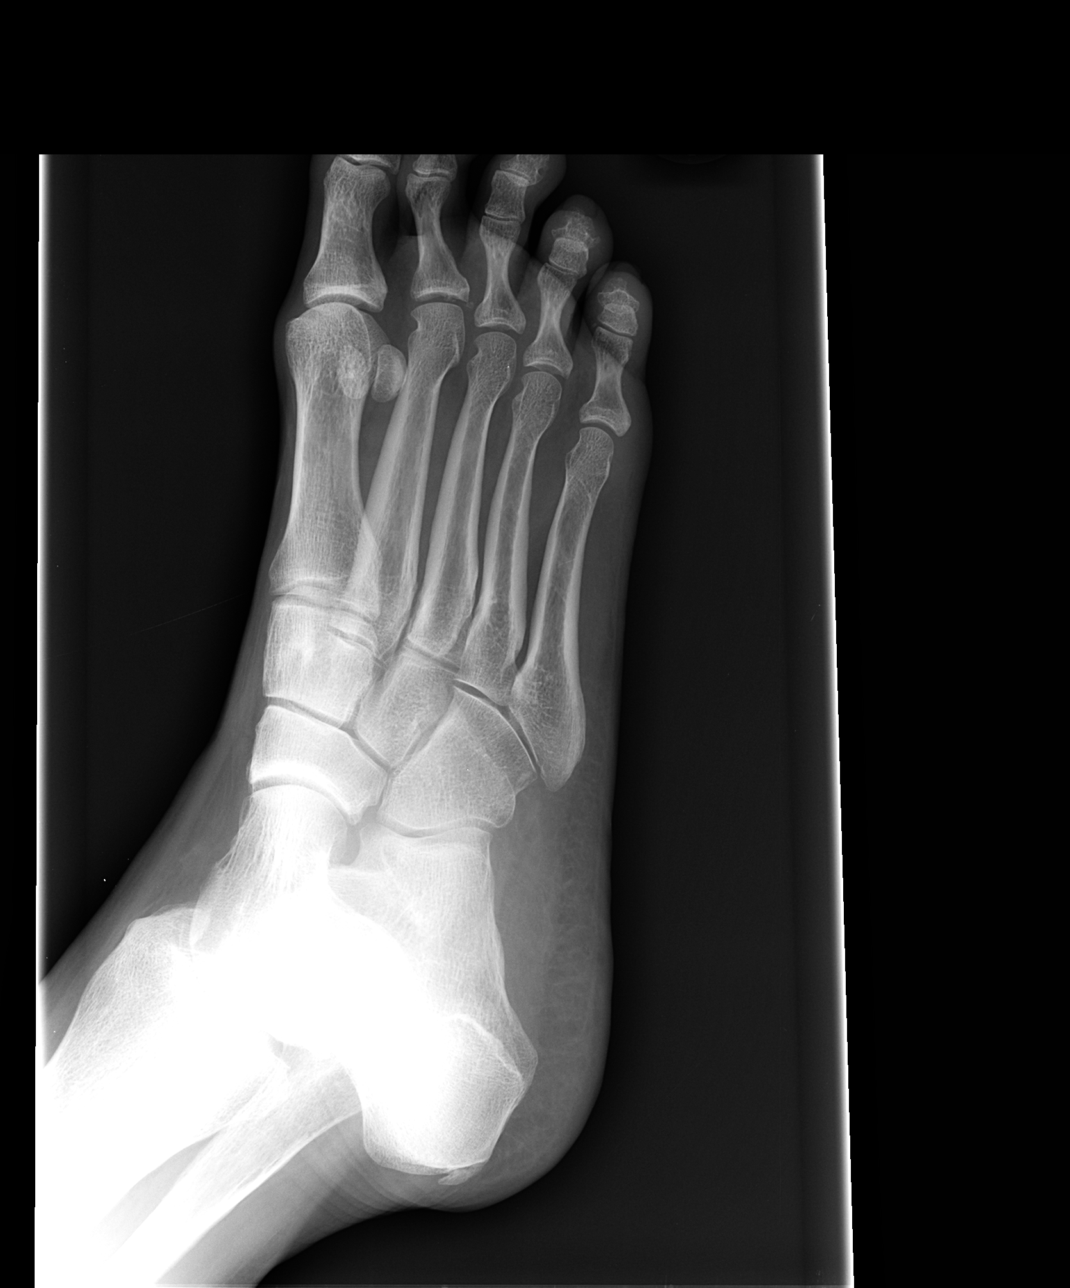

[view not recorded (3 of 3)]
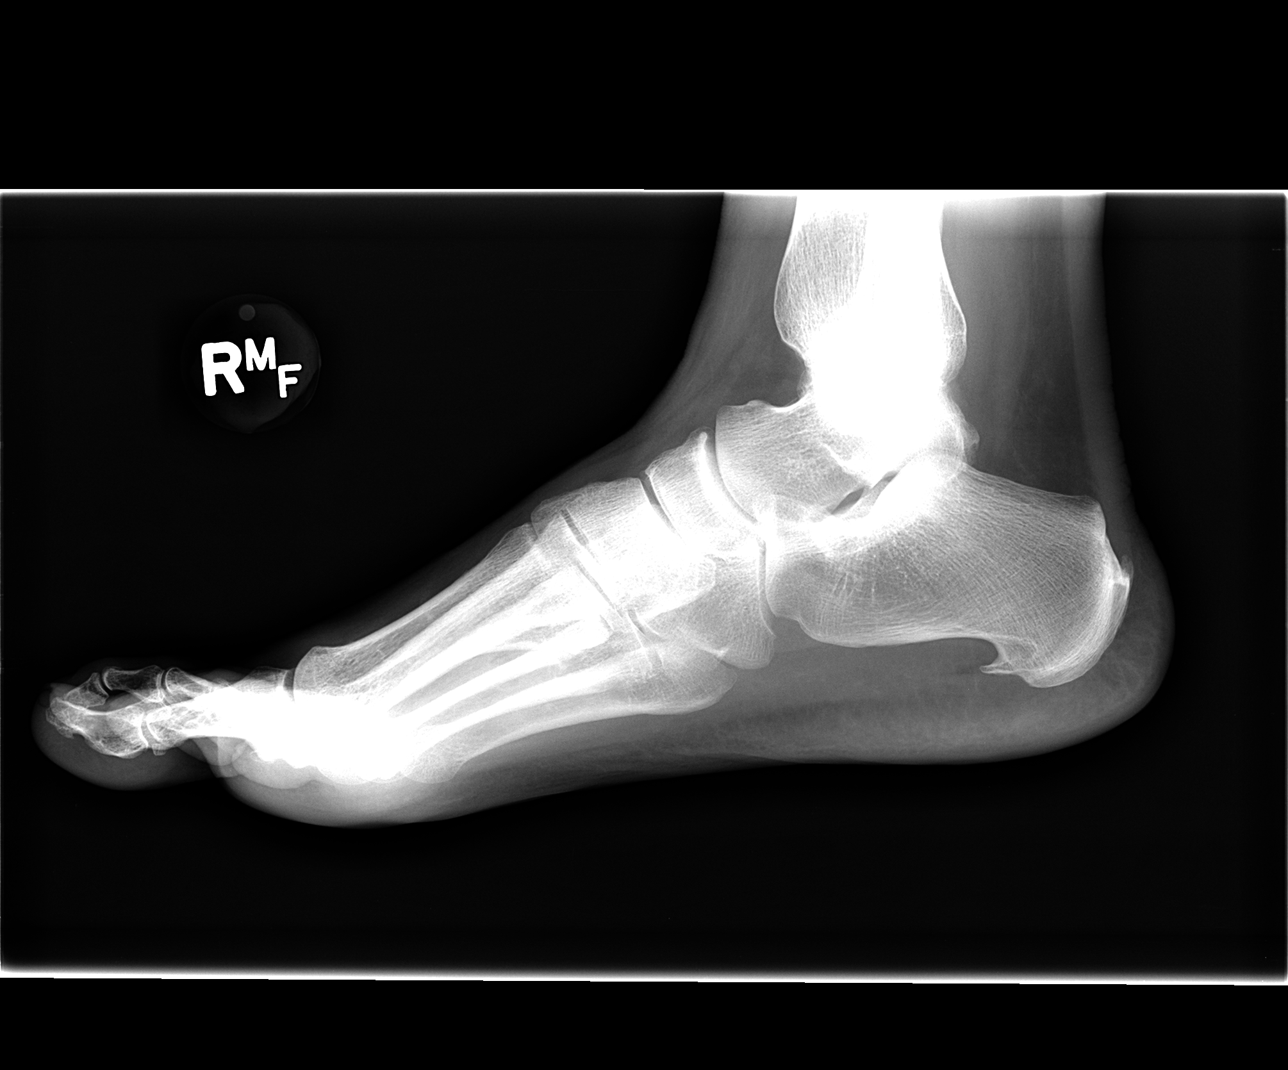

[3 of 3 positions shown; findings below may reference images not displayed]

FINDINGS: Very mild cortical irregularity is noted at the base of the fifth
metatarsal at its articulation with cuboid bone. This could
represent a small cortical fracture. No other focal abnormality is
seen. Small calcaneal spurs are noted.
IMPRESSION: Questionable cortical fracture at the base of the fifth metatarsal.
Correlation to point tenderness is recommended.

## 2016-08-23 DEATH — deceased

## 2017-02-26 IMAGING — CR DG WRIST COMPLETE 3+V*L*
4 series · 4 of 4 positions shown · non-contrast
Comparison: None.

CLINICAL DATA: Left hand injury after falling off a treadmill
yesterday. Initial encounter.

EXAM:
LEFT WRIST - COMPLETE 3+ VIEW

[wrist pa]
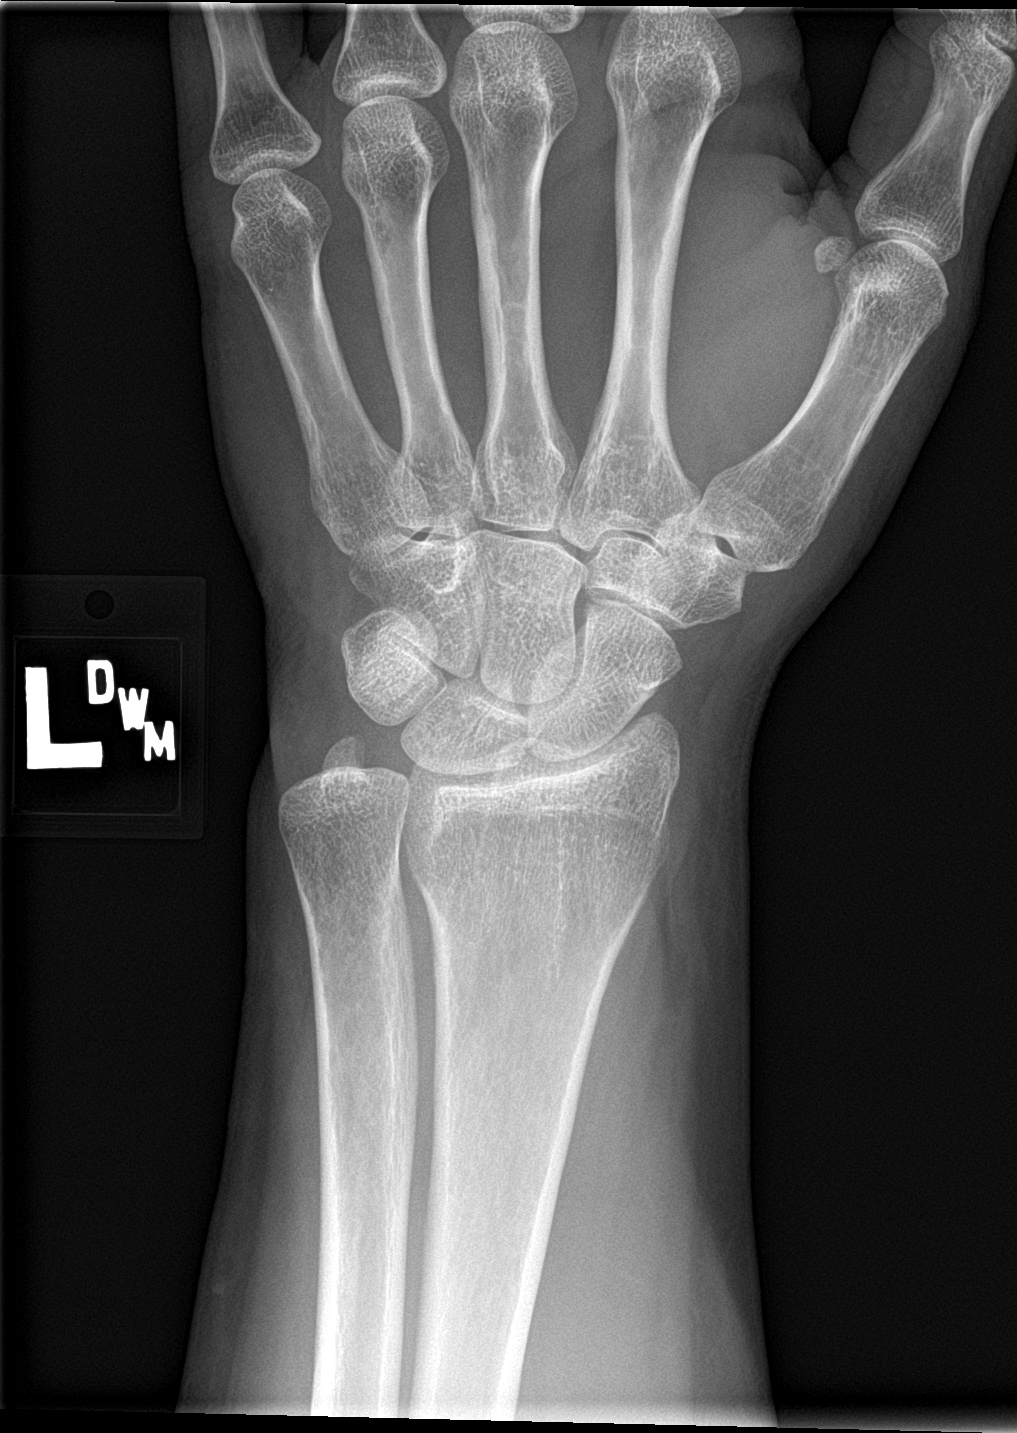

[wrist obl]
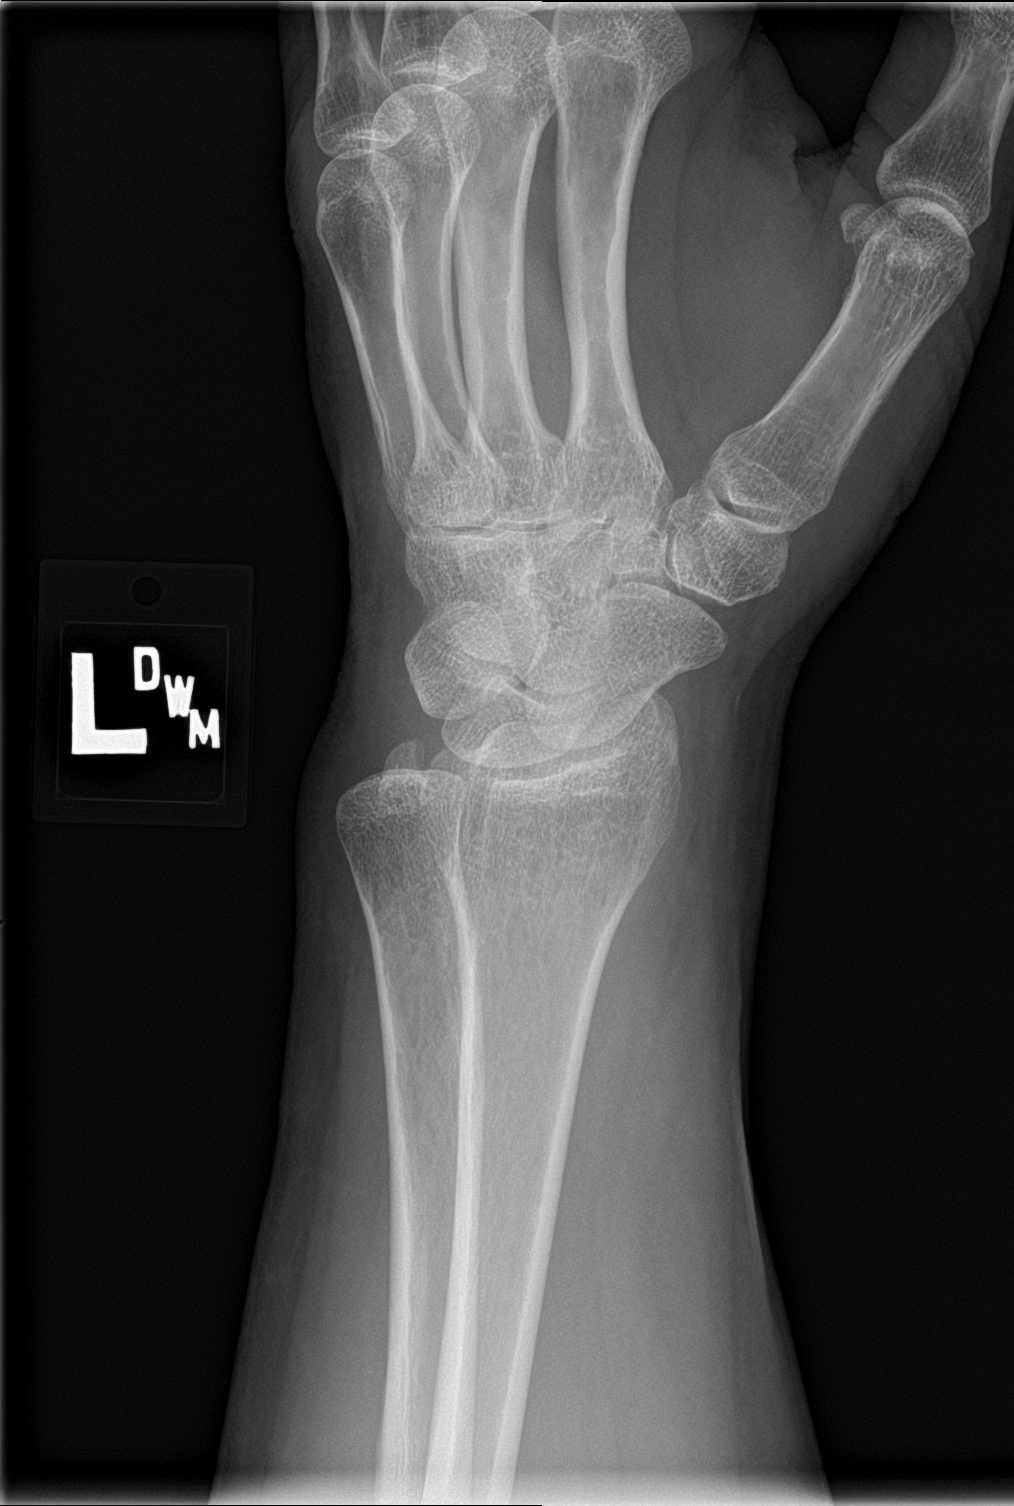

[wrist lat]
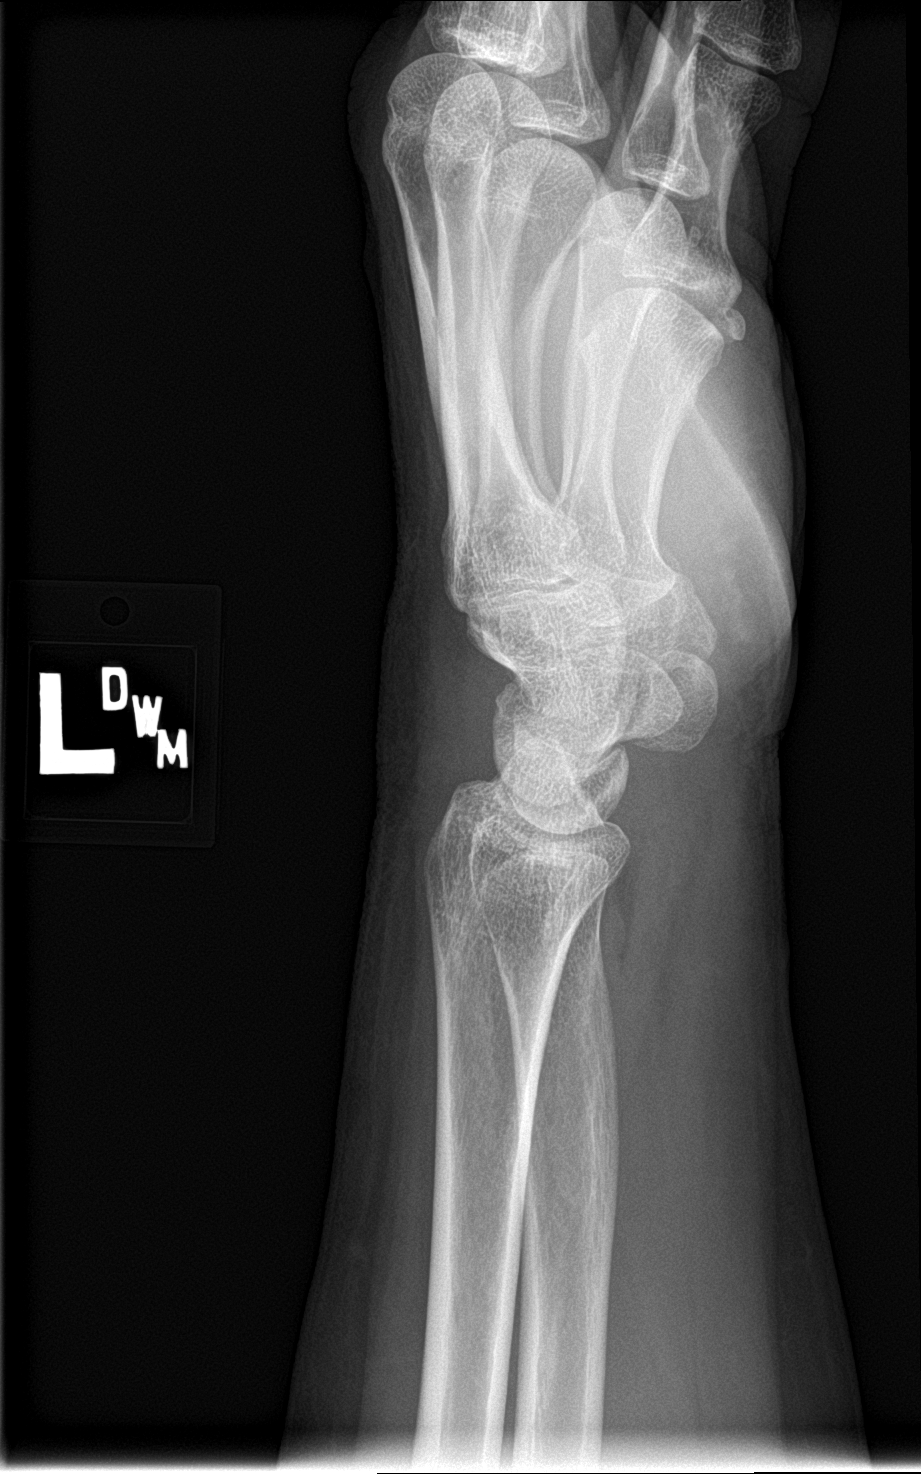

[wrist navicular]
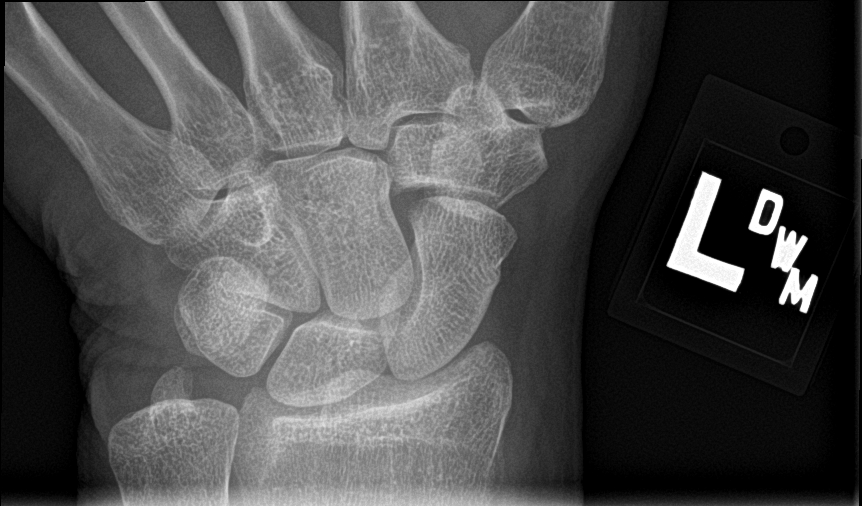

[4 of 4 positions shown; findings below may reference images not displayed]

FINDINGS: The mineralization and alignment are normal. There is no evidence of
acute fracture or dislocation. The joint spaces are maintained. No
focal soft tissue swelling or foreign bodies identified.
IMPRESSION: No acute osseous findings.
